# Patient Record
Sex: Female | Born: 1950 | Race: White | Hispanic: No | Smoking: Current every day smoker
Health system: Southern US, Community
[De-identification: ages and names within clinical notes are randomized; demographics above are authoritative.]

## PROBLEM LIST (undated history)

## (undated) DIAGNOSIS — M533 Sacrococcygeal disorders, not elsewhere classified: Secondary | ICD-10-CM

## (undated) DIAGNOSIS — R222 Localized swelling, mass and lump, trunk: Secondary | ICD-10-CM

## (undated) DIAGNOSIS — R0602 Shortness of breath: Secondary | ICD-10-CM

## (undated) DIAGNOSIS — M069 Rheumatoid arthritis, unspecified: Secondary | ICD-10-CM

## (undated) DIAGNOSIS — M5430 Sciatica, unspecified side: Secondary | ICD-10-CM

## (undated) DIAGNOSIS — F419 Anxiety disorder, unspecified: Secondary | ICD-10-CM

## (undated) DIAGNOSIS — R1310 Dysphagia, unspecified: Secondary | ICD-10-CM

## (undated) DIAGNOSIS — M1990 Unspecified osteoarthritis, unspecified site: Secondary | ICD-10-CM

## (undated) DIAGNOSIS — J449 Chronic obstructive pulmonary disease, unspecified: Secondary | ICD-10-CM

## (undated) DIAGNOSIS — C801 Malignant (primary) neoplasm, unspecified: Secondary | ICD-10-CM

## (undated) DIAGNOSIS — Z923 Personal history of irradiation: Secondary | ICD-10-CM

## (undated) DIAGNOSIS — C3490 Malignant neoplasm of unspecified part of unspecified bronchus or lung: Secondary | ICD-10-CM

## (undated) HISTORY — PX: ABDOMINAL HYSTERECTOMY: SHX81

## (undated) HISTORY — PX: RHINOPLASTY: SUR1284

---

## 2002-05-22 ENCOUNTER — Encounter: Payer: Self-pay | Admitting: Family Medicine

## 2002-05-22 ENCOUNTER — Ambulatory Visit (HOSPITAL_COMMUNITY): Admission: RE | Admit: 2002-05-22 | Discharge: 2002-05-22 | Payer: Self-pay | Admitting: Internal Medicine

## 2003-06-04 ENCOUNTER — Encounter: Payer: Self-pay | Admitting: Family Medicine

## 2003-06-04 ENCOUNTER — Ambulatory Visit (HOSPITAL_COMMUNITY): Admission: RE | Admit: 2003-06-04 | Discharge: 2003-06-04 | Payer: Self-pay | Admitting: Family Medicine

## 2004-05-04 ENCOUNTER — Ambulatory Visit (HOSPITAL_COMMUNITY): Admission: RE | Admit: 2004-05-04 | Discharge: 2004-05-04 | Payer: Self-pay | Admitting: Family Medicine

## 2005-03-22 ENCOUNTER — Ambulatory Visit (HOSPITAL_COMMUNITY): Admission: RE | Admit: 2005-03-22 | Discharge: 2005-03-22 | Payer: Self-pay | Admitting: Family Medicine

## 2005-03-31 ENCOUNTER — Ambulatory Visit (HOSPITAL_COMMUNITY): Admission: RE | Admit: 2005-03-31 | Discharge: 2005-03-31 | Payer: Self-pay | Admitting: Family Medicine

## 2005-08-17 ENCOUNTER — Ambulatory Visit (HOSPITAL_COMMUNITY): Admission: RE | Admit: 2005-08-17 | Discharge: 2005-08-17 | Payer: Self-pay | Admitting: Rheumatology

## 2013-01-23 ENCOUNTER — Encounter (HOSPITAL_COMMUNITY): Payer: Self-pay | Admitting: Emergency Medicine

## 2013-01-23 ENCOUNTER — Emergency Department (HOSPITAL_COMMUNITY)
Admission: EM | Admit: 2013-01-23 | Discharge: 2013-01-23 | Disposition: A | Discharge status: Home / Self Care | Payer: BC Managed Care – PPO | Attending: Emergency Medicine | Admitting: Emergency Medicine

## 2013-01-23 ENCOUNTER — Emergency Department (HOSPITAL_COMMUNITY): Payer: BC Managed Care – PPO

## 2013-01-23 DIAGNOSIS — M5431 Sciatica, right side: Secondary | ICD-10-CM

## 2013-01-23 DIAGNOSIS — M5430 Sciatica, unspecified side: Secondary | ICD-10-CM | POA: Insufficient documentation

## 2013-01-23 DIAGNOSIS — J449 Chronic obstructive pulmonary disease, unspecified: Secondary | ICD-10-CM

## 2013-01-23 DIAGNOSIS — J441 Chronic obstructive pulmonary disease with (acute) exacerbation: Secondary | ICD-10-CM | POA: Insufficient documentation

## 2013-01-23 DIAGNOSIS — F17200 Nicotine dependence, unspecified, uncomplicated: Secondary | ICD-10-CM | POA: Insufficient documentation

## 2013-01-23 DIAGNOSIS — M129 Arthropathy, unspecified: Secondary | ICD-10-CM | POA: Insufficient documentation

## 2013-01-23 HISTORY — DX: Unspecified osteoarthritis, unspecified site: M19.90

## 2013-01-23 MED ORDER — ALBUTEROL SULFATE (5 MG/ML) 0.5% IN NEBU
5.0000 mg | INHALATION_SOLUTION | Freq: Once | RESPIRATORY_TRACT | Status: AC
  Administered 2013-01-23: 5 mg via RESPIRATORY_TRACT
  Filled 2013-01-23: qty 1

## 2013-01-23 MED ORDER — IPRATROPIUM BROMIDE 0.02 % IN SOLN
0.5000 mg | Freq: Once | RESPIRATORY_TRACT | Status: AC
  Administered 2013-01-23: 0.5 mg via RESPIRATORY_TRACT
  Filled 2013-01-23: qty 2.5

## 2013-01-23 MED ORDER — IBUPROFEN 800 MG PO TABS: 800.0000 mg | ORAL_TABLET | Freq: Three times a day (TID) | ORAL | Status: DC

## 2013-01-23 MED ORDER — TRAMADOL HCL 50 MG PO TABS: 50.0000 mg | ORAL_TABLET | Freq: Four times a day (QID) | ORAL | Status: DC | PRN

## 2013-01-23 MED ORDER — ALBUTEROL SULFATE HFA 108 (90 BASE) MCG/ACT IN AERS: 1.0000 | INHALATION_SPRAY | Freq: Four times a day (QID) | RESPIRATORY_TRACT | Status: DC | PRN

## 2013-01-23 NOTE — ED Provider Notes (Signed)
History  This chart was scribed for Kristina Lennert, MD by Shari Heritage, ED Scribe. The patient was seen in room APA03/APA03. Patient's care was started at [phone].   CSN: [id number]  Arrival date & time 01/23/13  1191   First MD Initiated Contact with Patient 01/23/13 [phone]      Chief Complaint  Patient presents with  . Hip Pain  . Wheezing    Patient is a 62 y.o. female presenting with hip pain. The history is provided by the patient. No language interpreter was used.  Hip Pain This is a new problem. Episode onset: 1 month ago. The problem has been gradually worsening. Pertinent negatives include no chest pain, no abdominal pain, no headaches and no shortness of breath. Exacerbated by: sitting down. The symptoms are relieved by walking. She has tried nothing for the symptoms.     HPI Comments: Kristina Fischer is a 62 y.o. female who presents to the Emergency Department complaining of moderate to severe right hip pain that radiates down her right leg for the past month. She states that about 1 month ago, she ran into a large metal object at work. She says that she did not have pain right away. Patient states that she later developed some numbness then pain in the right hip and leg. Patient reports that pain is worse while she is sitting down, but improved with ambulation.She states that she has been taking Aleve once a day. Patient is also complaining of persistent wheezing for the past several months. Patient denies any other symptoms at this time. She has a medical history of arthritis, but reports no other chronic medical conditions. She has no PCP. Patient is a current every day smoker and does not use alcohol.   Past Medical History  Diagnosis Date  . Arthritis     Past Surgical History  Procedure Laterality Date  . Abdominal hysterectomy      History reviewed. No pertinent family history.  History  Substance Use Topics  . Smoking status: Current Every Day Smoker -- 1.00  packs/day    Types: Cigarettes  . Smokeless tobacco: Not on file  . Alcohol Use: No    OB History   Grav Para Term Preterm Abortions TAB SAB Ect Mult Living                  Review of Systems  Constitutional: Negative for appetite change and fatigue.  HENT: Negative for congestion, sinus pressure and ear discharge.   Eyes: Negative for discharge.  Respiratory: Positive for wheezing. Negative for cough and shortness of breath.   Cardiovascular: Negative for chest pain.  Gastrointestinal: Negative for abdominal pain and diarrhea.  Genitourinary: Negative for frequency and hematuria.  Musculoskeletal: Negative for back pain.  Skin: Negative for rash.  Neurological: Negative for seizures and headaches.  Psychiatric/Behavioral: Negative for hallucinations.    Allergies  Review of patient's allergies indicates no known allergies.  Home Medications   Current Outpatient Rx  Name  Route  Sig  Dispense  Refill  . naproxen sodium (ALEVE) 220 MG tablet   Oral   Take 220-440 mg by mouth 2 (two) times daily with a meal.         . OVER THE COUNTER MEDICATION   Oral   Take 2 capsules by mouth daily. Natural Supplement from health food store for arthritis.           Triage Vitals: BP 151/94  Pulse 100  Temp(Src) 97.8 F (  36.6 C) (Oral)  Resp 22  Ht 5\' 3"  (1.6 m)  Wt 112 lb (50.803 kg)  BMI 19.84 kg/m2  SpO2 99%  Physical Exam  Constitutional: She is oriented to person, place, and time. She appears well-developed.  HENT:  Head: Normocephalic.  Eyes: Conjunctivae and EOM are normal. No scleral icterus.  Neck: Neck supple. No thyromegaly present.  Cardiovascular: Normal rate and regular rhythm.  Exam reveals no gallop and no friction rub.   No murmur heard. Pulmonary/Chest: No stridor. She has wheezes (mild, bilaterally). She has no rales. She exhibits no tenderness.  Abdominal: She exhibits no distension. There is no tenderness. There is no rebound.  Musculoskeletal:  Normal range of motion. She exhibits no edema.  Negative straight leg raise bilaterally.  Lymphadenopathy:    She has no cervical adenopathy.  Neurological: She is oriented to person, place, and time. Coordination normal.  Skin: No rash noted. No erythema.  Psychiatric: She has a normal mood and affect. Her behavior is normal.    ED Course  Procedures (including critical care time) DIAGNOSTIC STUDIES: Oxygen Saturation is 99% on room air, normal by my interpretation.    COORDINATION OF CARE: 8:50 AM- Patient informed of current plan for treatment and evaluation and agrees with plan at this time.   9:42 AM- Discussed importance of quitting smoking and that persistent wheezing is likely result of this. X-ray is negative for fractures. Suspect muscle strain. Will prescribe NSAIDs and pain medicines to reduce inflammation and for pain management as needed.     Dg Hip Complete Right  01/23/2013  *RADIOLOGY REPORT*  Clinical Data: Right hip pain, wheezing, no known injury  RIGHT HIP - COMPLETE 2+ VIEW  Comparison: None.  Findings: Three views of the right hip submitted.  No acute fracture or subluxation.  Hip joint space is preserved.  IMPRESSION: No acute fractures or subluxation.   Original Report Authenticated By: Natasha Mead, M.D.      No diagnosis found.    MDM        The chart was scribed for me under my direct supervision.  I personally performed the history, physical, and medical decision making and all procedures in the evaluation of this patient.Kristina Lennert, MD 01/23/13 [phone]

## 2013-01-23 NOTE — ED Notes (Signed)
Pt states right leg/hip pain for one month. Pt states she is also having wheezing that started during the winter.

## 2013-02-26 ENCOUNTER — Encounter (HOSPITAL_COMMUNITY): Payer: Self-pay

## 2013-02-26 ENCOUNTER — Encounter (HOSPITAL_COMMUNITY): Payer: Self-pay | Admitting: *Deleted

## 2013-02-26 ENCOUNTER — Emergency Department (HOSPITAL_COMMUNITY): Payer: BC Managed Care – PPO

## 2013-02-26 ENCOUNTER — Telehealth: Payer: Self-pay | Admitting: Internal Medicine

## 2013-02-26 ENCOUNTER — Emergency Department (HOSPITAL_COMMUNITY)
Admission: EM | Admit: 2013-02-26 | Discharge: 2013-02-26 | Discharge status: Against Medical Advice | Payer: BC Managed Care – PPO | Attending: Emergency Medicine | Admitting: Emergency Medicine

## 2013-02-26 ENCOUNTER — Emergency Department (HOSPITAL_COMMUNITY)
Admission: EM | Admit: 2013-02-26 | Discharge: 2013-02-26 | Disposition: A | Discharge status: Home / Self Care | Payer: BC Managed Care – PPO | Attending: Emergency Medicine | Admitting: Emergency Medicine

## 2013-02-26 DIAGNOSIS — R222 Localized swelling, mass and lump, trunk: Secondary | ICD-10-CM | POA: Insufficient documentation

## 2013-02-26 DIAGNOSIS — R059 Cough, unspecified: Secondary | ICD-10-CM | POA: Insufficient documentation

## 2013-02-26 DIAGNOSIS — Z90710 Acquired absence of both cervix and uterus: Secondary | ICD-10-CM | POA: Insufficient documentation

## 2013-02-26 DIAGNOSIS — M79609 Pain in unspecified limb: Secondary | ICD-10-CM | POA: Insufficient documentation

## 2013-02-26 DIAGNOSIS — R109 Unspecified abdominal pain: Secondary | ICD-10-CM | POA: Insufficient documentation

## 2013-02-26 DIAGNOSIS — R918 Other nonspecific abnormal finding of lung field: Secondary | ICD-10-CM

## 2013-02-26 DIAGNOSIS — Z79899 Other long term (current) drug therapy: Secondary | ICD-10-CM | POA: Insufficient documentation

## 2013-02-26 DIAGNOSIS — J4489 Other specified chronic obstructive pulmonary disease: Secondary | ICD-10-CM | POA: Insufficient documentation

## 2013-02-26 DIAGNOSIS — R0602 Shortness of breath: Secondary | ICD-10-CM | POA: Insufficient documentation

## 2013-02-26 DIAGNOSIS — M129 Arthropathy, unspecified: Secondary | ICD-10-CM | POA: Insufficient documentation

## 2013-02-26 DIAGNOSIS — R05 Cough: Secondary | ICD-10-CM | POA: Insufficient documentation

## 2013-02-26 DIAGNOSIS — R209 Unspecified disturbances of skin sensation: Secondary | ICD-10-CM | POA: Insufficient documentation

## 2013-02-26 DIAGNOSIS — F17200 Nicotine dependence, unspecified, uncomplicated: Secondary | ICD-10-CM | POA: Insufficient documentation

## 2013-02-26 DIAGNOSIS — M25559 Pain in unspecified hip: Secondary | ICD-10-CM | POA: Insufficient documentation

## 2013-02-26 DIAGNOSIS — Z8739 Personal history of other diseases of the musculoskeletal system and connective tissue: Secondary | ICD-10-CM | POA: Insufficient documentation

## 2013-02-26 DIAGNOSIS — M545 Low back pain, unspecified: Secondary | ICD-10-CM | POA: Insufficient documentation

## 2013-02-26 DIAGNOSIS — M5430 Sciatica, unspecified side: Secondary | ICD-10-CM | POA: Insufficient documentation

## 2013-02-26 DIAGNOSIS — J449 Chronic obstructive pulmonary disease, unspecified: Secondary | ICD-10-CM | POA: Insufficient documentation

## 2013-02-26 HISTORY — DX: Sciatica, unspecified side: M54.30

## 2013-02-26 HISTORY — DX: Chronic obstructive pulmonary disease, unspecified: J44.9

## 2013-02-26 LAB — URINALYSIS, ROUTINE W REFLEX MICROSCOPIC
Bilirubin Urine: NEGATIVE
Hgb urine dipstick: NEGATIVE
Ketones, ur: NEGATIVE mg/dL
Protein, ur: NEGATIVE mg/dL
Urobilinogen, UA: 0.2 mg/dL (ref 0.0–1.0)

## 2013-02-26 LAB — COMPREHENSIVE METABOLIC PANEL
ALT: 18 U/L (ref 0–35)
Alkaline Phosphatase: 105 U/L (ref 39–117)
Chloride: 98 mEq/L (ref 96–112)
GFR calc Af Amer: >90 mL/min (ref >90)
Glucose, Bld: 110 mg/dL — ABNORMAL HIGH (ref 70–99)
Potassium: 3.9 mEq/L (ref 3.5–5.1)
Sodium: 139 mEq/L (ref 135–145)
Total Bilirubin: 0.2 mg/dL — ABNORMAL LOW (ref 0.3–1.2)
Total Protein: 6.9 g/dL (ref 6.0–8.3)

## 2013-02-26 LAB — CBC WITH DIFFERENTIAL/PLATELET
Basophils Relative: 1 % (ref 0–1)
Eosinophils Absolute: 0.1 10*3/uL (ref 0.0–0.7)
HCT: 39.9 % (ref 36.0–46.0)
Hemoglobin: 13.2 g/dL (ref 12.0–15.0)
MCH: 30.4 pg (ref 26.0–34.0)
MCHC: 33.1 g/dL (ref 30.0–36.0)
Monocytes Absolute: 0.6 10*3/uL (ref 0.1–1.0)
Monocytes Relative: 6 % (ref 3–12)
Neutrophils Relative %: 81 % — ABNORMAL HIGH (ref 43–77)

## 2013-02-26 MED ORDER — OXYCODONE-ACETAMINOPHEN 5-325 MG PO TABS: 1.0000 | ORAL_TABLET | Freq: Four times a day (QID) | ORAL | Status: DC | PRN

## 2013-02-26 MED ORDER — ONDANSETRON HCL 4 MG/2ML IJ SOLN
4.0000 mg | Freq: Once | INTRAMUSCULAR | Status: AC
  Administered 2013-02-26: 4 mg via INTRAVENOUS
  Filled 2013-02-26: qty 2

## 2013-02-26 MED ORDER — IOHEXOL 300 MG/ML  SOLN
100.0000 mL | Freq: Once | INTRAMUSCULAR | Status: AC | PRN
  Administered 2013-02-26: 100 mL via INTRAVENOUS

## 2013-02-26 MED ORDER — IOHEXOL 300 MG/ML  SOLN
50.0000 mL | INTRAMUSCULAR | Status: AC
  Administered 2013-02-26: 50 mL via ORAL

## 2013-02-26 MED ORDER — SODIUM CHLORIDE 0.9 % IV BOLUS (SEPSIS): 500.0000 mL | Freq: Once | INTRAVENOUS | Status: DC

## 2013-02-26 MED ORDER — SODIUM CHLORIDE 0.9 % IV SOLN: Freq: Once | INTRAVENOUS | Status: DC

## 2013-02-26 MED ORDER — HYDROMORPHONE HCL PF 1 MG/ML IJ SOLN
1.0000 mg | Freq: Once | INTRAMUSCULAR | Status: AC
  Administered 2013-02-26: 1 mg via INTRAVENOUS
  Filled 2013-02-26: qty 1

## 2013-02-26 MED ORDER — ACETAMINOPHEN 325 MG PO TABS
650.0000 mg | ORAL_TABLET | Freq: Once | ORAL | Status: AC
  Administered 2013-02-26: 650 mg via ORAL
  Filled 2013-02-26: qty 2

## 2013-02-26 NOTE — ED Provider Notes (Signed)
History     This chart was scribed for Kristina Quarry, MD, MD by Smitty Pluck, ED Scribe. The patient was seen in room APA15/APA15 and the patient's care was started at 8:52 AM.   CSN: [id number]  Arrival date & time 02/26/13  0836      No chief complaint on file.    Patient is a 62 y.o. female presenting with leg pain. The history is provided by medical records and the patient. No language interpreter was used.  Leg Pain Location:  Leg and hip Time since incident:  1 month Hip location:  R hip Leg location:  R leg Pain details:    Quality:  Unable to specify   Severity:  Moderate   Onset quality:  Sudden   Duration:  1 month   Timing:  Intermittent   Progression:  Unchanged Chronicity:  Recurrent Dislocation: no   Foreign body present:  No foreign bodies Prior injury to area:  Yes Relieved by:  Movement Worsened by:  Nothing tried Associated symptoms: no fever    HPI Comments: Kristina Fischer is a 62 y.o. female who presents to the Emergency Department complaining of intermittent, moderate lower back back pain radiating to right leg that has been ongoing for past month. Pt reports that she was seen in ED 1 month ago and diagnosed with sciatica. She states that she hit her right leg on metal object prior to being see in ED last month and had right leg pain 2-3 days after the incident. Pt mentions having numbness in right leg. She states walking helps relieve the pain. Pt states she feels she has a mass in her throat and she had trouble swallowing. She reports that she is not eating due to mass and she has lost 12 lbs within past month. She mentions when she eats she has abdominal pain. She mentions her last meal was 2 crackers, cookies and chips. She denies decrease in appetite but states she does not eat due to discomfort when swallowing. Pt smokes cigarettes and drinks alcohol. She states she had shot of vodka yesterday and had resulting abdominal pain. He states she has  nonproductive cough and SOB. Pt denies fever, chills, nausea, vomiting, diarrhea, weakness and any other pain.   PCP Dr. Margo Aye and her next visit is March 13, 2013.    Past Medical History  Diagnosis Date  . Arthritis     Past Surgical History  Procedure Laterality Date  . Abdominal hysterectomy      No family history on file.  History  Substance Use Topics  . Smoking status: Current Every Day Smoker -- 1.00 packs/day    Types: Cigarettes  . Smokeless tobacco: Not on file  . Alcohol Use: No    OB History   Grav Para Term Preterm Abortions TAB SAB Ect Mult Living                  Review of Systems  Constitutional: Negative for fever and chills.  HENT: Positive for trouble swallowing.   Respiratory: Positive for cough and shortness of breath.   Gastrointestinal: Positive for abdominal pain.  Neurological: Positive for numbness. Negative for weakness.  All other systems reviewed and are negative.    Allergies  Review of patient's allergies indicates no known allergies.  Home Medications   Current Outpatient Rx  Name  Route  Sig  Dispense  Refill  . albuterol (PROVENTIL HFA;VENTOLIN HFA) 108 (90 BASE) MCG/ACT inhaler   Inhalation  Inhale 1-2 puffs into the lungs every 6 (six) hours as needed for wheezing.   1 Inhaler   0   . naproxen sodium (ALEVE) 220 MG tablet   Oral   Take 220-440 mg by mouth 2 (two) times daily with a meal.         . OVER THE COUNTER MEDICATION   Oral   Take 2 capsules by mouth daily. Natural Supplement from health food store for arthritis.         Marland Kitchen traMADol (ULTRAM) 50 MG tablet   Oral   Take 1 tablet (50 mg total) by mouth every 6 (six) hours as needed for pain.   30 tablet   0     There were no vitals taken for this visit.  Physical Exam  Nursing note and vitals reviewed. Constitutional: She is oriented to person, place, and time. She appears well-developed and well-nourished. No distress.  HENT:  Head: Normocephalic  and atraumatic.  Neck: Normal range of motion. Neck supple. No JVD present. No tracheal deviation present.  Cardiovascular: Normal rate, regular rhythm and normal heart sounds.   Pulmonary/Chest: Effort normal and breath sounds normal. No respiratory distress. She has no wheezes. She has no rales.  Abdominal: Soft. She exhibits no distension. There is no tenderness. There is no rebound.  Musculoskeletal: Normal range of motion.  Good pulses in bilateral feet  Lymphadenopathy:    She has no cervical adenopathy.  Neurological: She is alert and oriented to person, place, and time. No cranial nerve deficit. Coordination normal.  Skin: Skin is warm and dry.  Psychiatric: She has a normal mood and affect. Her behavior is normal.    ED Course  Procedures (including critical care time) DIAGNOSTIC STUDIES: Oxygen Saturation is 94% on room air, low by my interpretation.    COORDINATION OF CARE: 9:01 AM Discussed ED treatment with pt and pt agrees.   Results for orders placed during the hospital encounter of 02/26/13  COMPREHENSIVE METABOLIC PANEL      Result Value Range   Sodium 139  135 - 145 mEq/L   Potassium 3.9  3.5 - 5.1 mEq/L   Chloride 98  96 - 112 mEq/L   CO2 30  19 - 32 mEq/L   Glucose, Bld 110 (*) 70 - 99 mg/dL   BUN 9  6 - 23 mg/dL   Creatinine, Ser 0.98  0.50 - 1.10 mg/dL   Calcium 9.2  8.4 - 11.9 mg/dL   Total Protein 6.9  6.0 - 8.3 g/dL   Albumin 2.8 (*) 3.5 - 5.2 g/dL   AST 27  0 - 37 U/L   ALT 18  0 - 35 U/L   Alkaline Phosphatase 105  39 - 117 U/L   Total Bilirubin 0.2 (*) 0.3 - 1.2 mg/dL   GFR calc non Af Amer >90  >90 mL/min   GFR calc Af Amer >90  >90 mL/min  LIPASE, BLOOD      Result Value Range   Lipase 15  11 - 59 U/L  CBC WITH DIFFERENTIAL      Result Value Range   WBC 10.1  4.0 - 10.5 K/uL   RBC 4.34  3.87 - 5.11 MIL/uL   Hemoglobin 13.2  12.0 - 15.0 g/dL   HCT 14.7  82.9 - 56.2 %   MCV 91.9  78.0 - 100.0 fL   MCH 30.4  26.0 - 34.0 pg   MCHC 33.1   30.0 - 36.0 g/dL  RDW 13.3  11.5 - 15.5 %   Platelets 463 (*) 150 - 400 K/uL   Neutrophils Relative % 81 (*) 43 - 77 %   Neutro Abs 8.1 (*) 1.7 - 7.7 K/uL   Lymphocytes Relative 12  12 - 46 %   Lymphs Abs 1.2  0.7 - 4.0 K/uL   Monocytes Relative 6  3 - 12 %   Monocytes Absolute 0.6  0.1 - 1.0 K/uL   Eosinophils Relative 1  0 - 5 %   Eosinophils Absolute 0.1  0.0 - 0.7 K/uL   Basophils Relative 1  0 - 1 %   Basophils Absolute 0.1  0.0 - 0.1 K/uL  URINALYSIS, ROUTINE W REFLEX MICROSCOPIC      Result Value Range   Color, Urine YELLOW  YELLOW   APPearance CLEAR  CLEAR   Specific Gravity, Urine 1.010  1.005 - 1.030   pH 6.5  5.0 - 8.0   Glucose, UA NEGATIVE  NEGATIVE mg/dL   Hgb urine dipstick NEGATIVE  NEGATIVE   Bilirubin Urine NEGATIVE  NEGATIVE   Ketones, ur NEGATIVE  NEGATIVE mg/dL   Protein, ur NEGATIVE  NEGATIVE mg/dL   Urobilinogen, UA 0.2  0.0 - 1.0 mg/dL   Nitrite NEGATIVE  NEGATIVE   Leukocytes, UA NEGATIVE  NEGATIVE     Dg Chest 2 View  02/26/2013   *RADIOLOGY REPORT*  Clinical Data: Dysphagia  CHEST - 2 VIEW  Comparison: August 17, 2005.  Findings: Hyperexpansion of the lungs is again noted consistent with chronic obstructive pulmonary disease.  Linear density is noted in left mid lung most consistent with scarring or subsegmental atelectasis.  There appears to be atelectasis of the right middle lobe with associated pleural effusion which is new since prior exam.  IMPRESSION: Atelectasis or pneumonia of right middle lobe is noted with associated pleural effusion; clinical correlation is recommended to rule out central endobronchial obstructive lesion or neoplasm.   Original Report Authenticated By: Lupita Raider.,  M.D.   Ct Chest W Contrast  02/26/2013   *RADIOLOGY REPORT*  Clinical Data:  Abnormal chest radiograph epigastric pain and weight loss.  CT CHEST, ABDOMEN AND PELVIS WITH CONTRAST  Technique:  Multidetector CT imaging of the chest, abdomen and pelvis was  performed following the standard protocol during bolus administration of intravenous contrast.  Contrast: OMNIPAQUE IOHEXOL 300 MG/ML  SOLN  Comparison:  Chest radiograph 02/26/2013 cough.  CT CHEST  Findings:  Low right paratracheal lymph node measures 2.1 cm and appears necrotic.  A large nodal mass occupies the subcarinal and right hilar regions, measuring approximately 5.8 x 7.5 cm. Narrowing of the right upper and right middle lobe pulmonary arteries in association.  Mass effect on the left atrium. A necrotic appearing lymph node intimately associated with the pericardial border measures 1.5 x 1.6 cm (image 39). Heart size normal.  No pericardial effusion.  Marked narrowing of the anterior segmental bronchus of the right upper lobe with complete obstruction of the right middle and right lower lobe bronchi.  Mass-like consolidation in the right middle lobe.  Peribronchovascular nodularity and airspace consolidation in the right lower lobe.  Small right pleural effusion.  Possible pleural thickening in the upper medial right hemithorax and is image 9). Calcified pleural plaques in the left hemithorax.  IMPRESSION:  1.  Bulky mediastinal/right hilar nodal mass with mass-like consolidation in the right middle lobe and postobstructive pneumonitis/consolidation in the right lower lobe. Please see additional findings discussion below. 2.  Small right pleural effusion, possibly malignant. 3.  Necrotic appearing lymph node intimately associated with the pericardium.  CT ABDOMEN AND PELVIS  Findings:  Heterogeneous low attenuation lesions in the right hepatic lobe measure up to 1.7 x 1.8 cm (image 60).  A necrotic appearing nodal mass in the porta hepatis measures 3.0 x 3.8 cm. Narrowing and probable invasion of the main portal vein in association (example image 62).  Gallbladder is unremarkable.  A lesion in the right adrenal gland measures 1.2 x 1.8 cm.  Left adrenal lesion measures 0.9 x 1.5 cm.  Kidneys,  spleen, pancreas, stomach and bowel are unremarkable.  A 2 mm nodule is seen in the posterior left lower quadrant, adjacent to the descending colon (image 79).  Similarly, a subcutaneous nodule just superior to the left iliac crest measures 1.0 x 1.3 cm (image 84).  Atherosclerotic calcification of the arterial vasculature without abdominal aortic aneurysm.  No pathologically enlarged retroperitoneal or mesenteric lymph nodes.  No free fluid.  Lytic expansion is seen in the right sacrum with associated fracture (coronal image 51).  Sclerotic lesions are seen in the right iliac wing and T3 vertebral body and are not necessarily metastatic as bone islands would appear similar.  IMPRESSION:  1.  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  Additional soft tissue nodules in the left lower quadrant and in the subcutaneous fat overlying the left iliac crest.  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 2.  Necrotic appearing porta hepatis adenopathy with probable invasion of the adjacent portal vein. 3.  Pathologic right sacral fracture.   Original Report Authenticated By: Leanna Battles, M.D.   Ct Abdomen Pelvis W Contrast  02/26/2013   *RADIOLOGY REPORT*  Clinical Data:  Abnormal chest radiograph epigastric pain and weight loss.  CT CHEST, ABDOMEN AND PELVIS WITH CONTRAST  Technique:  Multidetector CT imaging of the chest, abdomen and pelvis was performed following the standard protocol during bolus administration of intravenous contrast.  Contrast: OMNIPAQUE IOHEXOL 300 MG/ML  SOLN  Comparison:  Chest radiograph 02/26/2013 cough.  CT CHEST  Findings:  Low right paratracheal lymph node measures 2.1 cm and appears necrotic.  A large nodal mass occupies the subcarinal and right hilar regions, measuring approximately 5.8 x 7.5 cm. Narrowing of the right upper and right middle  lobe pulmonary arteries in association.  Mass effect on the left atrium. A necrotic appearing lymph node intimately associated with the pericardial border measures 1.5 x 1.6 cm (image 39). Heart size normal.  No pericardial effusion.  Marked narrowing of the anterior segmental bronchus of the right upper lobe with complete obstruction of the right middle and right lower lobe bronchi.  Mass-like consolidation in the right middle lobe.  Peribronchovascular nodularity and airspace consolidation in the right lower lobe.  Small right pleural effusion.  Possible pleural thickening in the upper medial right hemithorax and is image 9). Calcified pleural plaques in the left hemithorax.  IMPRESSION:  1.  Bulky mediastinal/right hilar nodal mass with mass-like consolidation in the right middle lobe and postobstructive pneumonitis/consolidation in the right lower lobe. Please see additional findings discussion below. 2.  Small right pleural effusion, possibly malignant. 3.  Necrotic appearing lymph node intimately associated with the pericardium.  CT ABDOMEN AND PELVIS  Findings:  Heterogeneous low attenuation lesions in the right hepatic lobe measure up to 1.7 x 1.8 cm (image 60).  A necrotic appearing nodal mass in the porta hepatis measures 3.0 x 3.8 cm. Narrowing and probable invasion of the main portal vein in association (example image 62).  Gallbladder is unremarkable.  A lesion in the right adrenal gland measures 1.2 x 1.8 cm.  Left adrenal lesion measures 0.9 x 1.5 cm.  Kidneys, spleen, pancreas, stomach and bowel are unremarkable.  A 2 mm nodule is seen in the posterior left lower quadrant, adjacent to the descending colon (image 79).  Similarly, a subcutaneous nodule just superior to the left iliac crest measures 1.0 x 1.3 cm (image 84).  Atherosclerotic calcification of the arterial vasculature without abdominal aortic aneurysm.  No pathologically enlarged retroperitoneal or mesenteric lymph nodes.  No free fluid.   Lytic expansion is seen in the right sacrum with associated fracture (coronal image 51).  Sclerotic lesions are seen in the right iliac wing and T3 vertebral body and are not necessarily metastatic as bone islands would appear similar.  IMPRESSION:  1.  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  Additional soft tissue nodules in the left lower quadrant and in the subcutaneous fat overlying the left iliac crest.  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 2.  Necrotic appearing porta hepatis adenopathy with probable invasion of the adjacent portal vein. 3.  Pathologic right sacral fracture.   Original Report Authenticated By: Leanna Battles, M.D.    I have reviewed the report and personally reviewed the above radiology studies.   No diagnosis found.    MDM  62 y.o. Smoker presents today complaining of difficulty swallowing, dyspnea, and weight loss.  Work up shows a lung mass with metastatic disease and pathologic sacral fracture.    I discussed the above findings with the patient and need for further evaluation inpatient.  Patient left ama although advised that she needs to stay.  Patient left after I discussed the above with her but was, initially, staying so I could discuss further.  However, patient left before I was able to return and make plans for op f/u or treatment plan if she left.   I had advised her to return if she changes her mind.    I personally performed the services described in this documentation, which was scribed in my presence. The recorded information has been reviewed and considered.   Kristina Quarry, MD 02/28/13 1048

## 2013-02-26 NOTE — ED Provider Notes (Signed)
History     CSN: [id number]  Arrival date & time 02/26/13  1629   First MD Initiated Contact with Patient 02/26/13 1708      No chief complaint on file.   (Consider location/radiation/quality/duration/timing/severity/associated sxs/prior treatment) Patient is a 62 y.o. female presenting with hip pain. The history is provided by the patient (pt was seen earlier in ;the er for lung mass.  she left [facility] but is back now).  Hip Pain This is a new problem. The current episode started more than 2 days ago. The problem occurs constantly. The problem has not changed since onset.Pertinent negatives include no chest pain, no abdominal pain and no headaches. Nothing aggravates the symptoms. Nothing relieves the symptoms.    Past Medical History  Diagnosis Date  . Arthritis   . Sciatica   . COPD (chronic obstructive pulmonary disease)     Past Surgical History  Procedure Laterality Date  . Abdominal hysterectomy      History reviewed. No pertinent family history.  History  Substance Use Topics  . Smoking status: Current Every Day Smoker -- 1.00 packs/day    Types: Cigarettes  . Smokeless tobacco: Not on file  . Alcohol Use: Yes    OB History   Grav Para Term Preterm Abortions TAB SAB Ect Mult Living                  Review of Systems  Constitutional: Negative for appetite change and fatigue.  HENT: Negative for congestion, sinus pressure and ear discharge.   Eyes: Negative for discharge.  Respiratory: Negative for cough.   Cardiovascular: Negative for chest pain.  Gastrointestinal: Negative for abdominal pain and diarrhea.  Genitourinary: Negative for frequency and hematuria.  Musculoskeletal: Negative for back pain.       Pain right hip  Skin: Negative for rash.  Neurological: Negative for seizures and headaches.  Psychiatric/Behavioral: Negative for hallucinations.    Allergies  Review of patient's allergies indicates no known allergies.  Home Medications    Current Outpatient Rx  Name  Route  Sig  Dispense  Refill  . albuterol (PROVENTIL HFA;VENTOLIN HFA) 108 (90 BASE) MCG/ACT inhaler   Inhalation   Inhale 1-2 puffs into the lungs every 6 (six) hours as needed for wheezing.   1 Inhaler   0   . naproxen sodium (ALEVE) 220 MG tablet   Oral   Take 220-440 mg by mouth 2 (two) times daily with a meal.         . OVER THE COUNTER MEDICATION   Oral   Take 2 capsules by mouth daily. Natural Supplement from health food store for arthritis.         Marland Kitchen oxyCODONE-acetaminophen (PERCOCET/ROXICET) 5-325 MG per tablet   Oral   Take 1 tablet by mouth every 6 (six) hours as needed for pain.   20 tablet   0   . traMADol (ULTRAM) 50 MG tablet   Oral   Take 1 tablet (50 mg total) by mouth every 6 (six) hours as needed for pain.   30 tablet   0     BP 150/73  Pulse 114  Temp(Src) 96.9 F (36.1 C) (Oral)  Resp 18  Ht 5\' 3"  (1.6 m)  Wt 103 lb (46.72 kg)  BMI 18.25 kg/m2  SpO2 98%  Physical Exam  Constitutional: She is oriented to person, place, and time. She appears well-developed.  HENT:  Head: Normocephalic.  Eyes: Conjunctivae and EOM are normal. No scleral icterus.  Neck: Neck supple. No thyromegaly present.  Cardiovascular: Normal rate and regular rhythm.  Exam reveals no gallop and no friction rub.   No murmur heard. Pulmonary/Chest: No stridor. She has no wheezes. She has no rales. She exhibits no tenderness.  Abdominal: She exhibits no distension. There is no tenderness. There is no rebound.  Musculoskeletal: Normal range of motion. She exhibits no edema.  Tender right hip  Lymphadenopathy:    She has no cervical adenopathy.  Neurological: She is oriented to person, place, and time. Coordination normal.  Skin: No rash noted. No erythema.  Psychiatric: She has a normal mood and affect. Her behavior is normal.    ED Course  Procedures (including critical care time)  Labs Reviewed - No data to display Dg Chest 2  View  02/26/2013   *RADIOLOGY REPORT*  Clinical Data: Dysphagia  CHEST - 2 VIEW  Comparison: August 17, 2005.  Findings: Hyperexpansion of the lungs is again noted consistent with chronic obstructive pulmonary disease.  Linear density is noted in left mid lung most consistent with scarring or subsegmental atelectasis.  There appears to be atelectasis of the right middle lobe with associated pleural effusion which is new since prior exam.  IMPRESSION: Atelectasis or pneumonia of right middle lobe is noted with associated pleural effusion; clinical correlation is recommended to rule out central endobronchial obstructive lesion or neoplasm.   Original Report Authenticated By: Lupita Raider.,  M.D.   Ct Chest W Contrast  02/26/2013   *RADIOLOGY REPORT*  Clinical Data:  Abnormal chest radiograph epigastric pain and weight loss.  CT CHEST, ABDOMEN AND PELVIS WITH CONTRAST  Technique:  Multidetector CT imaging of the chest, abdomen and pelvis was performed following the standard protocol during bolus administration of intravenous contrast.  Contrast: OMNIPAQUE IOHEXOL 300 MG/ML  SOLN  Comparison:  Chest radiograph 02/26/2013 cough.  CT CHEST  Findings:  Low right paratracheal lymph node measures 2.1 cm and appears necrotic.  A large nodal mass occupies the subcarinal and right hilar regions, measuring approximately 5.8 x 7.5 cm. Narrowing of the right upper and right middle lobe pulmonary arteries in association.  Mass effect on the left atrium. A necrotic appearing lymph node intimately associated with the pericardial border measures 1.5 x 1.6 cm (image 39). Heart size normal.  No pericardial effusion.  Marked narrowing of the anterior segmental bronchus of the right upper lobe with complete obstruction of the right middle and right lower lobe bronchi.  Mass-like consolidation in the right middle lobe.  Peribronchovascular nodularity and airspace consolidation in the right lower lobe.  Small right pleural  effusion.  Possible pleural thickening in the upper medial right hemithorax and is image 9). Calcified pleural plaques in the left hemithorax.  IMPRESSION:  1.  Bulky mediastinal/right hilar nodal mass with mass-like consolidation in the right middle lobe and postobstructive pneumonitis/consolidation in the right lower lobe. Please see additional findings discussion below. 2.  Small right pleural effusion, possibly malignant. 3.  Necrotic appearing lymph node intimately associated with the pericardium.  CT ABDOMEN AND PELVIS  Findings:  Heterogeneous low attenuation lesions in the right hepatic lobe measure up to 1.7 x 1.8 cm (image 60).  A necrotic appearing nodal mass in the porta hepatis measures 3.0 x 3.8 cm. Narrowing and probable invasion of the main portal vein in association (example image 62).  Gallbladder is unremarkable.  A lesion in the right adrenal gland measures 1.2 x 1.8 cm.  Left adrenal lesion measures 0.9 x  1.5 cm.  Kidneys, spleen, pancreas, stomach and bowel are unremarkable.  A 2 mm nodule is seen in the posterior left lower quadrant, adjacent to the descending colon (image 79).  Similarly, a subcutaneous nodule just superior to the left iliac crest measures 1.0 x 1.3 cm (image 84).  Atherosclerotic calcification of the arterial vasculature without abdominal aortic aneurysm.  No pathologically enlarged retroperitoneal or mesenteric lymph nodes.  No free fluid.  Lytic expansion is seen in the right sacrum with associated fracture (coronal image 51).  Sclerotic lesions are seen in the right iliac wing and T3 vertebral body and are not necessarily metastatic as bone islands would appear similar.  IMPRESSION:  1.  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  Additional soft tissue nodules in the left lower quadrant and in the subcutaneous fat overlying the left iliac crest.  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 2.  Necrotic appearing porta hepatis adenopathy with probable invasion of the adjacent portal vein. 3.  Pathologic right sacral fracture.   Original Report Authenticated By: Leanna Battles, M.D.   Ct Abdomen Pelvis W Contrast  02/26/2013   *RADIOLOGY REPORT*  Clinical Data:  Abnormal chest radiograph epigastric pain and weight loss.  CT CHEST, ABDOMEN AND PELVIS WITH CONTRAST  Technique:  Multidetector CT imaging of the chest, abdomen and pelvis was performed following the standard protocol during bolus administration of intravenous contrast.  Contrast: OMNIPAQUE IOHEXOL 300 MG/ML  SOLN  Comparison:  Chest radiograph 02/26/2013 cough.  CT CHEST  Findings:  Low right paratracheal lymph node measures 2.1 cm and appears necrotic.  A large nodal mass occupies the subcarinal and right hilar regions, measuring approximately 5.8 x 7.5 cm. Narrowing of the right upper and right middle lobe pulmonary arteries in association.  Mass effect on the left atrium. A necrotic appearing lymph node intimately associated with the pericardial border measures 1.5 x 1.6 cm (image 39). Heart size normal.  No pericardial effusion.  Marked narrowing of the anterior segmental bronchus of the right upper lobe with complete obstruction of the right middle and right lower lobe bronchi.  Mass-like consolidation in the right middle lobe.  Peribronchovascular nodularity and airspace consolidation in the right lower lobe.  Small right pleural effusion.  Possible pleural thickening in the upper medial right hemithorax and is image 9). Calcified pleural plaques in the left hemithorax.  IMPRESSION:  1.  Bulky mediastinal/right hilar nodal mass with mass-like consolidation in the right middle lobe and postobstructive pneumonitis/consolidation in the right lower lobe. Please see additional findings discussion below. 2.  Small right pleural effusion, possibly malignant. 3.  Necrotic  appearing lymph node intimately associated with the pericardium.  CT ABDOMEN AND PELVIS  Findings:  Heterogeneous low attenuation lesions in the right hepatic lobe measure up to 1.7 x 1.8 cm (image 60).  A necrotic appearing nodal mass in the porta hepatis measures 3.0 x 3.8 cm. Narrowing and probable invasion of the main portal vein in association (example image 62).  Gallbladder is unremarkable.  A lesion in the right adrenal gland measures 1.2 x 1.8 cm.  Left adrenal lesion measures 0.9 x 1.5 cm.  Kidneys, spleen, pancreas, stomach and bowel are unremarkable.  A 2 mm nodule is seen in the posterior left lower quadrant, adjacent to the descending colon (image 79).  Similarly, a subcutaneous nodule just superior to the left iliac crest  measures 1.0 x 1.3 cm (image 84).  Atherosclerotic calcification of the arterial vasculature without abdominal aortic aneurysm.  No pathologically enlarged retroperitoneal or mesenteric lymph nodes.  No free fluid.  Lytic expansion is seen in the right sacrum with associated fracture (coronal image 51).  Sclerotic lesions are seen in the right iliac wing and T3 vertebral body and are not necessarily metastatic as bone islands would appear similar.  IMPRESSION:  1.  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  Additional soft tissue nodules in the left lower quadrant and in the subcutaneous fat overlying the left iliac crest.  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 2.  Necrotic appearing porta hepatis adenopathy with probable invasion of the adjacent portal vein. 3.  Pathologic right sacral fracture.   Original Report Authenticated By: Leanna Battles, M.D.     1. Lung mass       MDM  Lung mass,   I spoke with pulmonary and it was felt the pt could be worked up as an out pt.  She will be seen this week.        Benny Lennert, MD 02/26/13 [phone]

## 2013-02-26 NOTE — ED Notes (Signed)
Pt c/o right leg and foot pain for a month, also states she does not have a primary care md but has appt to see dr hall in June.  Pt reports that for at least a month she has felt a "fullness" in her throat like something is stuck.   No resp diff noted

## 2013-02-26 NOTE — ED Notes (Signed)
Pt started drinking 2nd bottle now of contrast. Nad.

## 2013-02-26 NOTE — Telephone Encounter (Signed)
I got a call from the physician at [facility] emergency department. Physician name is Dr. Estell Harpin. This patient went to ER and found to have lung mass with mets suggestive of lung cancer. Please work her into office for 02/27/13 aor 02/28/13. Patient might have seen Dr Delford Field as inpatient or opd; not sure  Dr. Kalman Shan, M.D., [facility].C.P Pulmonary and Critical Care Medicine Staff Physician [facility] [place] [facility] Pager: [phone], If no answer or between  15:00h - 7:00h: call [phone]  02/26/2013 6:27 PM

## 2013-02-26 NOTE — ED Notes (Signed)
Seen here earlier and dx with lung problem. Was to be admitted , but  Pt had to leave.

## 2013-02-27 NOTE — Telephone Encounter (Signed)
Appt set for tomorrow at 2pm with KC. Carron Curie, CMA

## 2013-02-28 ENCOUNTER — Encounter: Payer: Self-pay | Admitting: Pulmonary Disease

## 2013-02-28 ENCOUNTER — Ambulatory Visit (INDEPENDENT_AMBULATORY_CARE_PROVIDER_SITE_OTHER): Payer: BC Managed Care – PPO | Admitting: Pulmonary Disease

## 2013-02-28 ENCOUNTER — Telehealth: Payer: Self-pay | Admitting: *Deleted

## 2013-02-28 VITALS — BP 126/88 | HR 111 | Temp 98.2°F | Ht 61.5 in | Wt 100.8 lb

## 2013-02-28 DIAGNOSIS — S322XXA Fracture of coccyx, initial encounter for closed fracture: Secondary | ICD-10-CM

## 2013-02-28 DIAGNOSIS — S3210XA Unspecified fracture of sacrum, initial encounter for closed fracture: Secondary | ICD-10-CM | POA: Insufficient documentation

## 2013-02-28 DIAGNOSIS — R222 Localized swelling, mass and lump, trunk: Secondary | ICD-10-CM

## 2013-02-28 DIAGNOSIS — R918 Other nonspecific abnormal finding of lung field: Secondary | ICD-10-CM | POA: Insufficient documentation

## 2013-02-28 MED ORDER — ALPRAZOLAM 0.5 MG PO TABS: ORAL_TABLET | ORAL | Status: DC

## 2013-02-28 NOTE — Telephone Encounter (Signed)
1)  Spoke with Lyla Son at [facility]-- [phone] CT will be down for repair tomorrow at pts scheduled appt. Only open appt at 9am is booked up and [facility] is booked tomorrow. Dr Ruthe Mannan that you spoke with that "Okayed" the CT advised that she go ahead and have a U/S biopsy instead at 3pm.  Lyla Son states that patient however will NOT be done with U/S Biopsy appt in time to make it to her MRI---she is going to work on get appts moved around so that she may still have both appts on the same day tomorrow.   Pt will need to arrive at 1pm for U/S Biopsy.   Please advise if all this is okay and also if okay, order needs to be placed. Thanks.

## 2013-02-28 NOTE — Assessment & Plan Note (Signed)
The patient has bulky mediastinal and hilar lymphadenopathy with compression of the right middle and right lower lobe bronchi and evidence for distant metastases.  This is most consistent with small cell carcinoma.  She will obviously need a tissue diagnosis, followed by oncology evaluation.  I have spoken to interventional radiology, and they are able to biopsy one of the distant lesions tomorrow.  They will look at the scan and pick the best yield/safest biopsy site.

## 2013-02-28 NOTE — Telephone Encounter (Signed)
[name] from [facility] called back and stated she is off work now.  Please ask for Tiffany when we call back @ [phone] to advise about order. Kristina Fischer

## 2013-02-28 NOTE — Assessment & Plan Note (Signed)
The patient clearly has a pathologic fracture of her right sacrum.  I am concerned because she is having symptoms suggestive of nerve root involvement.  It is unclear if this is because of the sacral fracture, or if she could have spinal cord involvement with metastatic disease.  I have asked the patient to come in to the hospital today so that we could expedite the workup quickly because of her neurologic symptoms.  She would like to try and do this as an outpatient, and will be scheduled for an MRI of her lumbar spine in the morning.  She understands to go to the emergency room if she has any worsening neurologic symptoms of her lower extremities.

## 2013-02-28 NOTE — Patient Instructions (Addendum)
Will schedule for biopsy tomorrow by radiology to get a diagnosis Will try and get MRI of your spine for evaluation as well.

## 2013-02-28 NOTE — Progress Notes (Addendum)
  Subjective:    Patient ID: Kristina Fischer, female    DOB: Nov 25, 1950, 62 y.o.   MRN: [id number]  HPI The patient is a 62 year old female who been asked to see for an abnormal CT chest.  The patient has been having issues with right back and hip pain, and went to the emergency room recently with difficulty swallowing.  She felt that food was getting stuck in her upper chest when eating.  She had a CT scan of her chest done in the emergency room that showed massive nodal conglomerate that occupies the subcarinal and right hilar regions with compression of the right middle lobe and right lower lobe bronchus.  There was consolidation in the right middle lobe as well as the right lower lobe.  There was a small right pleural effusion.  In the abdomen, she was noted to have hepatic lesions, and adrenal lesion, as well as a lytic lesion in the right sacrum with associated fracture.  Attempts were made to admit the patient for further evaluation, however she wished to pursue an outpatient workup.  She has continued to have significant pain down her leg in any neuropathic distribution, as well as numbness in her right foot.  She has had some weakness in the right leg.  The patient has a long history of smoking, and continues to do so.  She has a chronic cough with very little mucus, and denies hemoptysis.  She has lost 15 pounds in the last 4 weeks.  She does not feel that her breathing is much different from her usual baseline.   Review of Systems  Constitutional: Negative for fever and unexpected weight change.  HENT: Positive for trouble swallowing. Negative for ear pain, nosebleeds, congestion, sore throat, rhinorrhea, sneezing, dental problem, postnasal drip and sinus pressure.   Eyes: Negative for redness and itching.  Respiratory: Positive for cough ( dry) and shortness of breath. Negative for chest tightness and wheezing.   Cardiovascular: Negative for palpitations and leg swelling.  Gastrointestinal:  Negative for nausea and vomiting.  Genitourinary: Negative for dysuria.  Musculoskeletal: Negative for joint swelling.  Skin: Negative for rash.  Neurological: Negative for headaches.  Hematological: Does not bruise/bleed easily.  Psychiatric/Behavioral: Negative for dysphoric mood. The patient is not nervous/anxious.        Objective:   Physical Exam Constitutional: thin female, no acute distress  HENT:  Nares patent without discharge  Oropharynx without exudate, palate and uvula are normal  Eyes:  Perrla, eomi, no scleral icterus  Neck:  No JVD, no TMG  Cardiovascular:  Normal rate, regular rhythm, no rubs or gallops.  No murmurs        Intact distal pulses  Pulmonary :  Decreased  breath sounds especially in right base, no stridor or respiratory distress   No rhonchi, or wheezing.  A few scattered crackles.    Abdominal:  Soft, nondistended, bowel sounds present.  No tenderness noted.   Musculoskeletal:  No lower extremity edema noted.  Lymph Nodes:  No cervical lymphadenopathy noted  Skin:  No cyanosis noted  Neurologic:  Alert, appropriate, moves all 4 extremities with some weakness in RLE.          Assessment & Plan:

## 2013-02-28 NOTE — Addendum Note (Signed)
Addended by: Barbaraann Share on: 02/28/2013 06:11 PM   Modules accepted: Level of Service

## 2013-02-28 NOTE — Telephone Encounter (Signed)
Kristina Fischer long calling again, wanting order placed. I explained that Dr Shelle Iron is still seeing patients and order will be placed in between or as soon as he is finished seeing patients.  Dr Shelle Iron please advise. thanks

## 2013-03-01 ENCOUNTER — Ambulatory Visit (HOSPITAL_COMMUNITY)
Admission: RE | Admit: 2013-03-01 | Discharge: 2013-03-01 | Disposition: A | Discharge status: Home / Self Care | Payer: BC Managed Care – PPO | Source: Ambulatory Visit | Attending: Pulmonary Disease | Admitting: Pulmonary Disease

## 2013-03-01 ENCOUNTER — Encounter (HOSPITAL_COMMUNITY): Payer: Self-pay

## 2013-03-01 ENCOUNTER — Other Ambulatory Visit: Payer: Self-pay | Admitting: Pulmonary Disease

## 2013-03-01 DIAGNOSIS — M5137 Other intervertebral disc degeneration, lumbosacral region: Secondary | ICD-10-CM | POA: Insufficient documentation

## 2013-03-01 DIAGNOSIS — M51379 Other intervertebral disc degeneration, lumbosacral region without mention of lumbar back pain or lower extremity pain: Secondary | ICD-10-CM | POA: Insufficient documentation

## 2013-03-01 DIAGNOSIS — J449 Chronic obstructive pulmonary disease, unspecified: Secondary | ICD-10-CM | POA: Insufficient documentation

## 2013-03-01 DIAGNOSIS — K769 Liver disease, unspecified: Secondary | ICD-10-CM

## 2013-03-01 DIAGNOSIS — C7A1 Malignant poorly differentiated neuroendocrine tumors: Secondary | ICD-10-CM | POA: Insufficient documentation

## 2013-03-01 DIAGNOSIS — C7B8 Other secondary neuroendocrine tumors: Secondary | ICD-10-CM | POA: Insufficient documentation

## 2013-03-01 DIAGNOSIS — R918 Other nonspecific abnormal finding of lung field: Secondary | ICD-10-CM

## 2013-03-01 DIAGNOSIS — S3210XA Unspecified fracture of sacrum, initial encounter for closed fracture: Secondary | ICD-10-CM

## 2013-03-01 DIAGNOSIS — J4489 Other specified chronic obstructive pulmonary disease: Secondary | ICD-10-CM | POA: Insufficient documentation

## 2013-03-01 DIAGNOSIS — K7689 Other specified diseases of liver: Secondary | ICD-10-CM | POA: Insufficient documentation

## 2013-03-01 LAB — APTT: aPTT: 37 seconds (ref 24–37)

## 2013-03-01 LAB — CBC
MCHC: 34 g/dL (ref 30.0–36.0)
Platelets: 474 10*3/uL — ABNORMAL HIGH (ref 150–400)
RDW: 13.5 % (ref 11.5–15.5)
WBC: 9.2 10*3/uL (ref 4.0–10.5)

## 2013-03-01 LAB — PROTIME-INR: Prothrombin Time: 14.5 seconds (ref 11.6–15.2)

## 2013-03-01 MED ORDER — FENTANYL CITRATE 0.05 MG/ML IJ SOLN
INTRAMUSCULAR | Status: AC
  Filled 2013-03-01: qty 4

## 2013-03-01 MED ORDER — MIDAZOLAM HCL 2 MG/2ML IJ SOLN
INTRAMUSCULAR | Status: AC
  Filled 2013-03-01: qty 4

## 2013-03-01 MED ORDER — SODIUM CHLORIDE 0.9 % IV SOLN
INTRAVENOUS | Status: DC
  Administered 2013-03-01: 13:00:00 via INTRAVENOUS

## 2013-03-01 MED ORDER — GADOBENATE DIMEGLUMINE 529 MG/ML IV SOLN
9.0000 mL | Freq: Once | INTRAVENOUS | Status: AC | PRN
  Administered 2013-03-01: 9 mL via INTRAVENOUS

## 2013-03-01 MED ORDER — MIDAZOLAM HCL 2 MG/2ML IJ SOLN
INTRAMUSCULAR | Status: AC | PRN
  Administered 2013-03-01: 2 mg via INTRAVENOUS

## 2013-03-01 MED ORDER — FENTANYL CITRATE 0.05 MG/ML IJ SOLN
INTRAMUSCULAR | Status: AC | PRN
  Administered 2013-03-01: 150 ug via INTRAVENOUS

## 2013-03-01 MED ORDER — HYDROMORPHONE HCL PF 2 MG/ML IJ SOLN: 1.0000 mg | INTRAMUSCULAR | Status: DC | PRN

## 2013-03-01 MED ORDER — HYDROMORPHONE HCL PF 1 MG/ML IJ SOLN
INTRAMUSCULAR | Status: AC
  Administered 2013-03-01: 1 mg
  Filled 2013-03-01: qty 1

## 2013-03-01 NOTE — Progress Notes (Signed)
Transported pt to [facility] via wheelchair.  Pt received 1mg  IV dilaudid at 1703, informed MRI staff of this.  Informed MRI staff of pt dressing to lower back and that pt had a bone marrow biopsy done this afternoon.  Informed MRI staff that pt's daughter would pick her up around 6pm at MRI.  Pt alert and in stable condition, VSS, afebrile upon transfer to MRI.  Pt had her belongings in a bag with her upon transfer.

## 2013-03-01 NOTE — Progress Notes (Signed)
Called pt's daughter and gave her update on pt d/c time.  Informed her to be at MRI for pt pickup around 6pm.  Daughter and grand-daughter also spoke to pt on the phone at this time.

## 2013-03-01 NOTE — H&P (Signed)
Chief Complaint: "I'm here for a biopsy" Referring Physician:Clance HPI: Kristina Fischer is an 62 y.o. female with findings of lung mass and multiple distant lesions concerning for metastases. She is referred to IR for tissue biopsy and after review of imaging, pt is scheduled for US guided liver lesion biopsy. PMHx and meds reviewed.  Past Medical History:  Past Medical History  Diagnosis Date  . Arthritis   . Sciatica   . COPD (chronic obstructive pulmonary disease)     Past Surgical History:  Past Surgical History  Procedure Laterality Date  . Abdominal hysterectomy      Family History: History reviewed. No pertinent family history.  Social History:  reports that she has been smoking Cigarettes.  She has been smoking about 1.00 pack per day. She does not have any smokeless tobacco history on file. She reports that  drinks alcohol. She reports that she does not use illicit drugs.  Allergies: No Known Allergies  Medications: inhaler (Taking) 1 Inhaler 0 01/23/2013 Sig - Route: Inhale 1-2 puffs into the lungs every 6 (six) hours as needed for wheezing. - Inhalation Class: Print Number of times this order has been changed since signing: 1 Order [address] oxyCODONE-acetaminophen (PERCOCET/ROXICET) 5-325 MG per tablet (Taking) 20 tablet 0 02/26/2013 Sig - Route: Take 1 tablet by mouth every 6 (six) hours as needed for pain. - Oral Class: Print Number of times this order has been changed since signing: 1 Order [address] OVER THE COUNTER MEDICATION Sig - Route: Take 2 capsules by mouth daily. Natural Supplement from health food store for arthritis    Please HPI for pertinent positives, otherwise complete 10 system ROS negative.  Physical Exam: Temp: 98.1, HR: 110, RR: 18, BP: 147/86, O2: 92%   General Appearance:  Alert, thin, cooperative, no distress, appears stated age  Head:  Normocephalic, without obvious abnormality, atraumatic  ENT: Unremarkable  Neck: Supple, symmetrical,  trachea midline  Lungs:   Clear to auscultation bilaterally, no w/r/r, respirations unlabored without use of accessory muscles.  Heart:  Regular rate and rhythm, S1, S2 normal, no murmur, rub or gallop.  Abdomen:   Soft, non-tender, non distended.  Neurologic: Normal affect, no gross deficits.   Results for orders placed during the hospital encounter of 03/01/13 (from the past 48 hour(s))  CBC     Status: Abnormal   Collection Time    03/01/13  1:10 PM      Result Value Range   WBC 9.2  4.0 - 10.5 K/uL   RBC 4.42  3.87 - 5.11 MIL/uL   Hemoglobin 13.4  12.0 - 15.0 g/dL   HCT 16.1  09.6 - 04.5 %   MCV 89.1  78.0 - 100.0 fL   MCH 30.3  26.0 - 34.0 pg   MCHC 34.0  30.0 - 36.0 g/dL   RDW 40.9  81.1 - 91.4 %   Platelets 474 (*) 150 - 400 K/uL  APTT     Status: None   Collection Time    03/01/13  1:10 PM      Result Value Range   aPTT 37  24 - 37 seconds   Comment:            IF BASELINE aPTT IS ELEVATED,     SUGGEST PATIENT RISK ASSESSMENT     BE USED TO DETERMINE APPROPRIATE     ANTICOAGULANT THERAPY.  PROTIME-INR     Status: None   Collection Time    03/01/13  1:10  PM      Result Value Range   Prothrombin Time 14.5  11.6 - 15.2 seconds   INR 1.15  0.00 - 1.49   No results found.  Assessment/Plan Lung mass with liver lesions For US guided liver lesion biopsy today Discussed procedure, risks, complications, use of sedation, and recovery. Labs reviewed. Consent signed in chart  Brayton El PA-C 03/01/2013, 1:42 PM

## 2013-03-01 NOTE — Procedures (Signed)
Sacral Bx 18 g core times 5 No comp

## 2013-03-02 ENCOUNTER — Encounter: Payer: Self-pay | Admitting: *Deleted

## 2013-03-02 ENCOUNTER — Other Ambulatory Visit (HOSPITAL_COMMUNITY): Payer: BC Managed Care – PPO

## 2013-03-02 ENCOUNTER — Telehealth: Payer: Self-pay | Admitting: Pulmonary Disease

## 2013-03-02 MED ORDER — HYDROCODONE-ACETAMINOPHEN 5-325 MG PO TABS: 1.0000 | ORAL_TABLET | Freq: Four times a day (QID) | ORAL | Status: DC | PRN

## 2013-03-02 NOTE — Telephone Encounter (Signed)
Please fax in script for this pt for percocet 5/325  One every 6hrs if needed for pain.  #20, no fills To [facility].

## 2013-03-02 NOTE — Telephone Encounter (Signed)
Per KC can call in vicodin 5/325 1-2 every 6 hrs prn #40 x 0 refills  Pt can either come pick up rx for the percocet or have the vicidon called in.   I called pt and she stated she will have the vicodin called in for her. I called [facility] and gave verbal order. Nothing further was needed

## 2013-03-02 NOTE — Progress Notes (Signed)
Histology and Location of Primary Cancer: Right Middle Lobe with bulky mediastinal/right hilar nodes  Sites of Visceral and Bony Metastatic Disease: Lytic Lesion in the right sacrum with associated fracture for which she was referred for Radiation Therapy.    Lesions in the right hepatic lobe, porta hepatis, right adrenal gland,left lower quadrant adjacent to the descending colon, and a "necrotic appearing lymph node intimately associated with the pericardium."  Location(s) of Symptomatic Metastases:  Presented to ED with right back and hip pain with radiation  Down her right leg and numbness in her right foot.- Lytic lesion in the right sacrum.  Weakness of right lower extremity C/o  difficulty swallowing and food getting stuck in her upper chest when eating.  Past/Anticipated chemotherapy by medical oncology, if any: Dr. Arbutus Ped on 03/08/13  Pain on a scale of 0-10 is: Pain right buttock, posterior right thigh and right ankle.  Unable to sit on the right buttock. Grades pain as a level 4 on a scale of 0-10 since she took pain medication.   If Spine Met(s), symptoms, if any, include:  Bowel/Bladder retention or incontinence (please describe): No  Numbness or weakness in extremities (please describe): Weakness of right leg and numbness of right foot  Current Decadron regimen, if applicable: No  Ambulatory status? Walker? Wheelchair?:   SAFETY ISSUES:  Prior radiation? No  Pacemaker/ICD? No   Possible current pregnancy? No  Is the patient on methotrexate? No  Current Complaints / other details:  Small right pleural effusion.                                                             She has lost 15 lbs in 4 weeks prior to 02/28/13 because of the discomfort associated with swallowing food.                                                            Nonproductive cough

## 2013-03-02 NOTE — Telephone Encounter (Signed)
I called [facility]. Was advised RX for percocet will have to be brought in by hand-can not be faxed in according to the pharmacists. Its a C2 medication. Please advise [facility] pt lives in [place]. thanks

## 2013-03-05 ENCOUNTER — Ambulatory Visit: Payer: BC Managed Care – PPO

## 2013-03-05 ENCOUNTER — Telehealth: Payer: Self-pay | Admitting: *Deleted

## 2013-03-05 ENCOUNTER — Ambulatory Visit: Payer: BC Managed Care – PPO | Admitting: Radiation Oncology

## 2013-03-05 ENCOUNTER — Ambulatory Visit
Admission: RE | Admit: 2013-03-05 | Discharge: 2013-03-05 | Disposition: A | Discharge status: Home / Self Care | Payer: BC Managed Care – PPO | Source: Ambulatory Visit | Attending: Radiation Oncology | Admitting: Radiation Oncology

## 2013-03-05 NOTE — Telephone Encounter (Signed)
Left vm message regarding appt for [facility]oc 03/08/13 at 2:00 arrive at 1:45.

## 2013-03-05 NOTE — Progress Notes (Signed)
This patient no-showed for today's urgent consultation. So far, my staff has not been able to reach her by phone to reschedule.  -----------------------------------  Lonie Peak, MD

## 2013-03-06 ENCOUNTER — Ambulatory Visit
Admission: RE | Admit: 2013-03-06 | Discharge: 2013-03-06 | Disposition: A | Discharge status: Home / Self Care | Payer: BC Managed Care – PPO | Source: Ambulatory Visit | Attending: Radiation Oncology | Admitting: Radiation Oncology

## 2013-03-06 ENCOUNTER — Encounter: Payer: Self-pay | Admitting: *Deleted

## 2013-03-06 ENCOUNTER — Telehealth: Payer: Self-pay | Admitting: *Deleted

## 2013-03-06 ENCOUNTER — Encounter: Payer: Self-pay | Admitting: Radiation Oncology

## 2013-03-06 ENCOUNTER — Telehealth: Payer: Self-pay | Admitting: Internal Medicine

## 2013-03-06 ENCOUNTER — Other Ambulatory Visit: Payer: BC Managed Care – PPO

## 2013-03-06 VITALS — BP 125/79 | HR 104 | Temp 98.0°F | Ht 61.5 in | Wt 102.2 lb

## 2013-03-06 DIAGNOSIS — R1310 Dysphagia, unspecified: Secondary | ICD-10-CM | POA: Insufficient documentation

## 2013-03-06 DIAGNOSIS — C7A1 Malignant poorly differentiated neuroendocrine tumors: Secondary | ICD-10-CM | POA: Insufficient documentation

## 2013-03-06 DIAGNOSIS — B370 Candidal stomatitis: Secondary | ICD-10-CM | POA: Insufficient documentation

## 2013-03-06 DIAGNOSIS — C7A090 Malignant carcinoid tumor of the bronchus and lung: Secondary | ICD-10-CM | POA: Insufficient documentation

## 2013-03-06 DIAGNOSIS — R918 Other nonspecific abnormal finding of lung field: Secondary | ICD-10-CM

## 2013-03-06 DIAGNOSIS — C342 Malignant neoplasm of middle lobe, bronchus or lung: Secondary | ICD-10-CM

## 2013-03-06 DIAGNOSIS — F17200 Nicotine dependence, unspecified, uncomplicated: Secondary | ICD-10-CM | POA: Insufficient documentation

## 2013-03-06 DIAGNOSIS — C7951 Secondary malignant neoplasm of bone: Secondary | ICD-10-CM | POA: Insufficient documentation

## 2013-03-06 DIAGNOSIS — C787 Secondary malignant neoplasm of liver and intrahepatic bile duct: Secondary | ICD-10-CM | POA: Insufficient documentation

## 2013-03-06 DIAGNOSIS — Z510 Encounter for antineoplastic radiation therapy: Secondary | ICD-10-CM | POA: Insufficient documentation

## 2013-03-06 DIAGNOSIS — IMO0002 Reserved for concepts with insufficient information to code with codable children: Secondary | ICD-10-CM | POA: Insufficient documentation

## 2013-03-06 DIAGNOSIS — Z79899 Other long term (current) drug therapy: Secondary | ICD-10-CM | POA: Insufficient documentation

## 2013-03-06 DIAGNOSIS — C7952 Secondary malignant neoplasm of bone marrow: Secondary | ICD-10-CM

## 2013-03-06 DIAGNOSIS — C7B8 Other secondary neuroendocrine tumors: Secondary | ICD-10-CM | POA: Insufficient documentation

## 2013-03-06 HISTORY — DX: Localized swelling, mass and lump, trunk: R22.2

## 2013-03-06 HISTORY — DX: Sacrococcygeal disorders, not elsewhere classified: M53.3

## 2013-03-06 MED ORDER — OXYCODONE HCL 5 MG PO TABS: 5.0000 mg | ORAL_TABLET | ORAL | Status: DC | PRN

## 2013-03-06 MED ORDER — DEXAMETHASONE 4 MG PO TABS: 4.0000 mg | ORAL_TABLET | Freq: Two times a day (BID) | ORAL | Status: DC

## 2013-03-06 MED ORDER — OXYCODONE-ACETAMINOPHEN 5-325 MG PO TABS
1.0000 | ORAL_TABLET | ORAL | Status: AC
  Administered 2013-03-06: 1 via ORAL
  Filled 2013-03-06: qty 1

## 2013-03-06 NOTE — Progress Notes (Signed)
Spoke with patient and daughter at [employer] today.  Information on lung cancer given and explained.  She is having SIM today and no questions at this time

## 2013-03-06 NOTE — Progress Notes (Addendum)
  Radiation Oncology         (336) [phone] ________________________________  Name: Kristina Fischer MRN: [id number]  Date: 03/06/2013  DOB: Feb 29, 1951  SIMULATION AND TREATMENT PLANNING NOTE  Outpatient  DIAGNOSIS:  Metastatic neuroendocrine lung cancer  NARRATIVE:  The patient was brought to the CT Simulation planning suite.  Identity was confirmed.  All relevant records and images related to the planned course of therapy were reviewed.  The patient freely provided informed written consent to proceed with treatment after reviewing the details related to the planned course of therapy. The consent form was witnessed and verified by the simulation staff.    Then, the patient was set-up in a stable reproducible  supine position for radiation therapy. Lower body in vaclock.  CT images were obtained.  Surface markings were placed.  The CT images were loaded into the planning software.    TREATMENT PLANNING NOTE: Treatment planning then occurred.  The radiation prescription was entered and confirmed.    A total of 7 medically necessary complex treatment devices were fabricated and supervised by me - 6  fields with MLCs for blocks against bowel, lungs, heart as well as a vaclock. I have requested : Isodose Plan/dose calc's.     The patient will receive 30Gy in 10 fractions with an AP/PA arrangement to her Right hilar/subcarinal and Right middle/lower lung disease.  30Gy/10 fractions with a 3 field plan to her sacral disease from L5-S4.  Treatment to start in about 48 hours. She is now on Dexamethasone.   -----------------------------------  Lonie Peak, MD

## 2013-03-06 NOTE — Telephone Encounter (Signed)
C/D 03/06/13 for appt. 03/08/13 °

## 2013-03-06 NOTE — Progress Notes (Signed)
Radiation Oncology         (336) [phone] ________________________________  Initial outpatient Consultation  Name: Kristina Fischer MRN: [id number]  Date: 03/06/2013  DOB: 23-Feb-1951  CC:No PCP Per Patient  Clance, Kristina Krabbe, MD   REFERRING PHYSICIAN: Shelle Iron Kristina Krabbe, MD  DIAGNOSIS: Neuroendocrine Carcinoma of the Right Lung, Small Cell Type  HISTORY OF PRESENT ILLNESS::Kristina Fischer is a 62 y.o. female who has a longstanding smoking history and lost about 15 lbs over the past 2 months.  She also had developed the sensation of food sticking in her lower esophagus and right Leg pain like a "toothache" associated with right foot numbness. Possibly some right leg weakness.  She has also has right buttock/sacral pain.  She was found by chest CT on 02/26/13 to have bulky mediastinal/right hilar nodal mass with mass like consolidation of this RML and post obstructive changes in the RLL.  Necrotic node associated with pericardium. Right pleural effusion (small.)  Mets to Liver, adrenal glands, and sacrum, as well as porta hepatis adenopathy.   MRI of lumbar spine showed (on 03/02/13) Bony metastatic disease with large right sacral lesion that  shows extraosseous extension into the right sided neural foramina  from L5-S1 through S3-S4 and right side of the sacral spinal canal.  Metastatic infiltration of the left sacral ala is present as well,  although to a lesser extent. Tumor encases the right sacral  nerves.  2. Medial left iliac bone metastatic disease with extraosseous  extension to the left iliopsoas muscle.  She is status post sacral biopsy  On 6/5 showing  Bone, biopsy, right sacral - HIGHLY POORLY DIFFERENTIATED NEUROENDOCRINE CARCINOMA, SMALL CELL TYPE, SEE COMMENT.  No MRI of brain or PET scan thus far; due to see [facility] physicians on Thurs 6/12. She does have some modest headaches and nausea, but not visual changes or other neurological complaints.   PREVIOUS RADIATION THERAPY:  NO  PAST MEDICAL HISTORY:  has a past medical history of Arthritis; Sciatica; COPD (chronic obstructive pulmonary disease); and Sacral mass.    PAST SURGICAL HISTORY: Past Surgical History  Procedure Laterality Date  . Abdominal hysterectomy    . Rhinoplasty      FAMILY HISTORY: family history includes Cancer in her brother and father; Lung cancer in her brother; and Ovarian cancer in her sister.  SOCIAL HISTORY:  reports that she has been smoking Cigarettes.  She has been smoking about 1.00 pack per day. She does not have any smokeless tobacco history on file. She reports that  drinks alcohol. She reports that she does not use illicit drugs.  ALLERGIES: Review of patient's allergies indicates no known allergies.  MEDICATIONS:  Current Outpatient Prescriptions  Medication Sig Dispense Refill  . albuterol (PROVENTIL HFA;VENTOLIN HFA) 108 (90 BASE) MCG/ACT inhaler Inhale 1-2 puffs into the lungs every 6 (six) hours as needed for wheezing.  1 Inhaler  0  . dexamethasone (DECADRON) 4 MG tablet Take 1 tablet (4 mg total) by mouth 2 (two) times daily with a meal.  40 tablet  0  . HYDROcodone-acetaminophen (NORCO/VICODIN) 5-325 MG per tablet Take 1-2 tablets by mouth every 6 (six) hours as needed for pain.  40 tablet  0  . OVER THE COUNTER MEDICATION Take 2 capsules by mouth daily. Natural Supplement from health food store for arthritis.      Marland Kitchen oxyCODONE (OXY IR/ROXICODONE) 5 MG immediate release tablet Take 1 tablet (5 mg total) by mouth every 3 (three) hours as needed for  pain.  60 tablet  0   No current facility-administered medications for this encounter.    REVIEW OF SYSTEMS:    Pertinent items are noted in HPI.   PHYSICAL EXAM:  height is 5' 1.5" (1.562 m) and weight is 102 lb 3.2 oz (46.358 kg). Her temperature is 98 F (36.7 C). Her blood pressure is 125/79 and her pulse is 104.   General: Alert and oriented, in no acute distress HEENT: Head is normocephalic. Pupils are equally  round and reactive to light. Extraocular movements are intact. Oropharynx is clear. Neck: Neck is supple, no palpable cervical or supraclavicular lymphadenopathy. Heart: Regular in rate and rhythm with no murmurs, rubs, or gallops. Chest: Decreased RLL sounds Abdomen:Firm RUQ with periumbilical tenderness Extremities: No cyanosis or edema. Lymphatics: No concerning lymphadenopathy. Skin: No concerning lesions. Musculoskeletal: symmetric strength and muscle tone throughout. Pain concentrated in R buttock currently Neurologic: Cranial nerves II through XII are grossly intact. No obvious focalities. Speech is fluent. Coordination is intact. Decreased sensation - Right foot Psychiatric: Judgment and insight are intact. Affect is appropriate.    LABORATORY DATA:  Lab Results  Component Value Date   WBC 9.2 03/01/2013   HGB 13.4 03/01/2013   HCT 39.4 03/01/2013   MCV 89.1 03/01/2013   PLT 474* 03/01/2013   CMP     Component Value Date/Time   NA 139 02/26/2013 0927   K 3.9 02/26/2013 0927   CL 98 02/26/2013 0927   CO2 30 02/26/2013 0927   GLUCOSE 110* 02/26/2013 0927   BUN 9 02/26/2013 0927   CREATININE 0.73 02/26/2013 0927   CALCIUM 9.2 02/26/2013 0927   PROT 6.9 02/26/2013 0927   ALBUMIN 2.8* 02/26/2013 0927   AST 27 02/26/2013 0927   ALT 18 02/26/2013 0927   ALKPHOS 105 02/26/2013 0927   BILITOT 0.2* 02/26/2013 0927   GFRNONAA >90 02/26/2013 0927   GFRAA >90 02/26/2013 0927         RADIOGRAPHY: Dg Chest 2 View  02/26/2013   *RADIOLOGY REPORT*  Clinical Data: Dysphagia  CHEST - 2 VIEW  Comparison: August 17, 2005.  Findings: Hyperexpansion of the lungs is again noted consistent with chronic obstructive pulmonary disease.  Linear density is noted in left mid lung most consistent with scarring or subsegmental atelectasis.  There appears to be atelectasis of the right middle lobe with associated pleural effusion which is new since prior exam.  IMPRESSION: Atelectasis or pneumonia of right middle lobe is noted with  associated pleural effusion; clinical correlation is recommended to rule out central endobronchial obstructive lesion or neoplasm.   Original Report Authenticated By: Lupita Raider.,  M.D.   Ct Chest W Contrast  02/26/2013   *RADIOLOGY REPORT*  Clinical Data:  Abnormal chest radiograph epigastric pain and weight loss.  CT CHEST, ABDOMEN AND PELVIS WITH CONTRAST  Technique:  Multidetector CT imaging of the chest, abdomen and pelvis was performed following the standard protocol during bolus administration of intravenous contrast.  Contrast: OMNIPAQUE IOHEXOL 300 MG/ML  SOLN  Comparison:  Chest radiograph 02/26/2013 cough.  CT CHEST  Findings:  Low right paratracheal lymph node measures 2.1 cm and appears necrotic.  A large nodal mass occupies the subcarinal and right hilar regions, measuring approximately 5.8 x 7.5 cm. Narrowing of the right upper and right middle lobe pulmonary arteries in association.  Mass effect on the left atrium. A necrotic appearing lymph node intimately associated with the pericardial border measures 1.5 x 1.6 cm (image 39). Heart  size normal.  No pericardial effusion.  Marked narrowing of the anterior segmental bronchus of the right upper lobe with complete obstruction of the right middle and right lower lobe bronchi.  Mass-like consolidation in the right middle lobe.  Peribronchovascular nodularity and airspace consolidation in the right lower lobe.  Small right pleural effusion.  Possible pleural thickening in the upper medial right hemithorax and is image 9). Calcified pleural plaques in the left hemithorax.  IMPRESSION:  1.  Bulky mediastinal/right hilar nodal mass with mass-like consolidation in the right middle lobe and postobstructive pneumonitis/consolidation in the right lower lobe. Please see additional findings discussion below. 2.  Small right pleural effusion, possibly malignant. 3.  Necrotic appearing lymph node intimately associated with the pericardium.  CT ABDOMEN  AND PELVIS  Findings:  Heterogeneous low attenuation lesions in the right hepatic lobe measure up to 1.7 x 1.8 cm (image 60).  A necrotic appearing nodal mass in the porta hepatis measures 3.0 x 3.8 cm. Narrowing and probable invasion of the main portal vein in association (example image 62).  Gallbladder is unremarkable.  A lesion in the right adrenal gland measures 1.2 x 1.8 cm.  Left adrenal lesion measures 0.9 x 1.5 cm.  Kidneys, spleen, pancreas, stomach and bowel are unremarkable.  A 2 mm nodule is seen in the posterior left lower quadrant, adjacent to the descending colon (image 79).  Similarly, a subcutaneous nodule just superior to the left iliac crest measures 1.0 x 1.3 cm (image 84).  Atherosclerotic calcification of the arterial vasculature without abdominal aortic aneurysm.  No pathologically enlarged retroperitoneal or mesenteric lymph nodes.  No free fluid.  Lytic expansion is seen in the right sacrum with associated fracture (coronal image 51).  Sclerotic lesions are seen in the right iliac wing and T3 vertebral body and are not necessarily metastatic as bone islands would appear similar.  IMPRESSION:  1.  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  Additional soft tissue nodules in the left lower quadrant and in the subcutaneous fat overlying the left iliac crest.  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 2.  Necrotic appearing porta hepatis adenopathy with probable invasion of the adjacent portal vein. 3.  Pathologic right sacral fracture.   Original Report Authenticated By: Leanna Battles, M.D.   Mr Lumbar Spine W Wo Contrast  03/02/2013   *RADIOLOGY REPORT*  Clinical Data: Metastatic cancer of unknown primary.  Biopsy earlier today.  Pain in the right leg and foot.  MRI LUMBAR SPINE WITHOUT AND WITH CONTRAST  Technique:  Multiplanar and multiecho pulse  sequences of the lumbar spine were obtained without and with intravenous contrast.  Contrast: 9mL MULTIHANCE GADOBENATE DIMEGLUMINE 529 MG/ML IV SOLN  Comparison: CT chest abdomen and pelvis 02/26/2013.  Findings: Known sacral metastatic disease is again identified. Large masses in the right sacral ala.  This shows some central nonenhancing tissue and central necrosis.  There is extraosseous extension dorsal to the inferior endplate of L5 and diffusely dorsal to the left S1-S3 segments.  Invasion of the neural foramina on the right at L5-S1 through S3-S4. Left sacral hila metastatic disease also present. Metastasis is present in the medial left iliac bone with effacement of normal fatty marrow.  There is also with extraosseous extension from the left iliac bone disease into the left iliopsoas muscle. There are no lumbar compression fractures.  There is a mild levoconvex  curve with the apex at L4. Spinal cord terminates posterior to the L1 vertebra. Bone marrow signal shows suppression of fatty marrow, which is a nonspecific finding, commonly associated with anemia, chronic disease, cigarette smoking, and obesity.  Paraspinal soft tissue assessment is deferred to recent CT.  Benign vertebral body hemangioma present at L1.  T11-T12 through L1-L2 discs appear normal. Post-gadolinium imaging shows no convincing evidence of intrathecal metastatic disease.  L2-L3:  Disc desiccation and mild bulging in both neural foramina without significant stenosis.  L3-L4:  Normal.  L4-L5:  Disc desiccation.  Small central annular tear and broad- based bulge.  Mild left foraminal stenosis associated with degenerative disease and left foraminal bulging.  Central canal appears patent.  Minimal encroachment on the left lateral recess of doubtful clinical significance.  L5-S1:  Extraosseous extension of tumor from the right sacral ala into the right lateral recess and right neural foramen.  Invasion of tumor from the sacrum and the right  piriformis muscle partially visualized.  IMPRESSION: 1.  Bony metastatic disease with large right sacral lesion that shows extraosseous extension into the right sided neural foramina from L5-S1 through S3-S4 and right side of the sacral spinal canal. Metastatic infiltration of the left sacral ala is present as well, although to a lesser extent.  Tumor encases the right sacral nerves. 2.  Medial left iliac bone metastatic disease with extraosseous extension to the left iliopsoas muscle. 3.  Mild lumbar spine degenerative disease most pronounced at L4- L5. 4.  No pathologic compression fracture of the lumbar spine.   Original Report Authenticated By: Andreas Newport, M.D.   Ct Abdomen Pelvis W Contrast  02/26/2013   *RADIOLOGY REPORT*  Clinical Data:  Abnormal chest radiograph epigastric pain and weight loss.  CT CHEST, ABDOMEN AND PELVIS WITH CONTRAST  Technique:  Multidetector CT imaging of the chest, abdomen and pelvis was performed following the standard protocol during bolus administration of intravenous contrast.  Contrast: OMNIPAQUE IOHEXOL 300 MG/ML  SOLN  Comparison:  Chest radiograph 02/26/2013 cough.  CT CHEST  Findings:  Low right paratracheal lymph node measures 2.1 cm and appears necrotic.  A large nodal mass occupies the subcarinal and right hilar regions, measuring approximately 5.8 x 7.5 cm. Narrowing of the right upper and right middle lobe pulmonary arteries in association.  Mass effect on the left atrium. A necrotic appearing lymph node intimately associated with the pericardial border measures 1.5 x 1.6 cm (image 39). Heart size normal.  No pericardial effusion.  Marked narrowing of the anterior segmental bronchus of the right upper lobe with complete obstruction of the right middle and right lower lobe bronchi.  Mass-like consolidation in the right middle lobe.  Peribronchovascular nodularity and airspace consolidation in the right lower lobe.  Small right pleural effusion.  Possible  pleural thickening in the upper medial right hemithorax and is image 9). Calcified pleural plaques in the left hemithorax.  IMPRESSION:  1.  Bulky mediastinal/right hilar nodal mass with mass-like consolidation in the right middle lobe and postobstructive pneumonitis/consolidation in the right lower lobe. Please see additional findings discussion below. 2.  Small right pleural effusion, possibly malignant. 3.  Necrotic appearing lymph node intimately associated with the pericardium.  CT ABDOMEN AND PELVIS  Findings:  Heterogeneous low attenuation lesions in the right hepatic lobe measure up to 1.7 x 1.8 cm (image 60).  A necrotic appearing nodal mass in the porta hepatis measures 3.0 x 3.8 cm. Narrowing and probable invasion of the main portal vein  in association (example image 62).  Gallbladder is unremarkable.  A lesion in the right adrenal gland measures 1.2 x 1.8 cm.  Left adrenal lesion measures 0.9 x 1.5 cm.  Kidneys, spleen, pancreas, stomach and bowel are unremarkable.  A 2 mm nodule is seen in the posterior left lower quadrant, adjacent to the descending colon (image 79).  Similarly, a subcutaneous nodule just superior to the left iliac crest measures 1.0 x 1.3 cm (image 84).  Atherosclerotic calcification of the arterial vasculature without abdominal aortic aneurysm.  No pathologically enlarged retroperitoneal or mesenteric lymph nodes.  No free fluid.  Lytic expansion is seen in the right sacrum with associated fracture (coronal image 51).  Sclerotic lesions are seen in the right iliac wing and T3 vertebral body and are not necessarily metastatic as bone islands would appear similar.  IMPRESSION:  1.  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  Additional soft tissue nodules in the left lower quadrant and in the subcutaneous fat overlying the left iliac crest.  Collectively and with findings in the chest detailed above, findings are most consistent with  stage IV primary bronchogenic carcinoma. 2.  Necrotic appearing porta hepatis adenopathy with probable invasion of the adjacent portal vein. 3.  Pathologic right sacral fracture.   Original Report Authenticated By: Leanna Battles, M.D.   US Abdomen Limited  03/01/2013   *RADIOLOGY REPORT*  Clinical Data: Multiple lesions worrisome for metastatic disease.  ABDOMEN ULTRASOUND LIMITED  Technique: Scanning in preparation for liver biopsy was performed.  Comparison:  None.  Findings: The lesion within the liver in question is towards the dome and difficult to approach by ultrasound.  IMPRESSION: The liver lesion is identified but difficult to approach.  The patient will be biopsied in CT.   Original Report Authenticated By: Jolaine Click, M.D.   Ct Biopsy  03/01/2013   *RADIOLOGY REPORT*  Clinical Data/Indication: METASTATIC DISEASE  CT-GUIDED BIOPSY right sacral biopsy  Sedation: Versed 2.0 mg, Fentanyl 150 mcg.  Total Moderate Sedation Time: 20 minutes.  Procedure: The procedure, risks, benefits, and alternatives were explained to the patient. Questions regarding the procedure were encouraged and answered. The patient understands and consents to the procedure.  The low back was prepped with betadine in a sterile fashion, and a sterile drape was applied covering the operative field. A sterile gown and sterile gloves were used for the procedure.  Under CT guidance, a 17 gauge needle was inserted into the right sacral lesion.  Five 18 gauge core biopsies were obtained. Final imaging was performed.  Patient tolerated the procedure well without complication.  Vital sign monitoring by nursing staff during the procedure will continue as patient is in the special procedures unit for post procedure observation.  Findings: The images document guide needle placement within the right sacral lesion.  Post biopsy images demonstrate no evidence of hemorrhage.  IMPRESSION: Successful CT-guided right sacral lesion biopsy.   Original  Report Authenticated By: Jolaine Click, M.D.      IMPRESSION/PLAN: [age] yo woman with new dx of Stage IV Neuroendocrine Lung cancer, Small Cell type.  I recommend the following  1) we will order a brain MRI for staging. I will defer to med/onc as to whether to order a PET  2) Start Dexamethasone 4mg  BID for numbness of foot, subjective modest weakness of right leg, and pain  3) Nutrition referral for 15lb wt loss  4) Oxycodone Rx for pain (pt prefers to hydrocodone)  5) Simulate today for palliative RT to main tumor in R hilum, central mediastinum, RML/RLL for dysphagia; Also, will plan RT to sacral disease.  Anticipate 30Gy/10 fractions. Will expedite start of treatment.  Consent form was signed today; patient is enthusiastic to proceed.  She understands her cancer is not curable.  Most common side effects of RT would include fatigue, skin irritation, esophagitis - as discussed today.  She will meet our patient navigator later today to solidify understanding of future plans.  I spent 45 minutes minutes face to face with the patient and more than 50% of that time was spent in counseling and/or coordination of care.    __________________________________________   Lonie Peak, MD

## 2013-03-06 NOTE — Progress Notes (Signed)
Called patient's insurance carrier to find out about coverage.  Spoke to [employer] [place],  No pre-cert req'd for op rad tx; based on medical necessity.  Plan has $600 deductible (currently met) and 20% coinsurance which both apply to the OOP max of $3000 (currently met $883.63).

## 2013-03-06 NOTE — Telephone Encounter (Signed)
Spoke with pt regarding [facility] appt 03/08/13 at 2:00 and Rad Onc, Dr. Basilio Cairo appt 03/09/13 at 2:00 arrive at 1:45.  She verbalized understanding of time and place of appt

## 2013-03-06 NOTE — Progress Notes (Signed)
Kristina Fischer c/o pain in her right buttock and right posterior thigh region.   She was given 1 percocet 5/325 mg tab for pain level as a 5-6 on a scale of 0-10.  Time 1432.    On simulation table now at 1532 and she grades her pain level as a 3 on a scale of 0-10.

## 2013-03-07 ENCOUNTER — Telehealth: Payer: Self-pay | Admitting: *Deleted

## 2013-03-07 DIAGNOSIS — C342 Malignant neoplasm of middle lobe, bronchus or lung: Secondary | ICD-10-CM | POA: Insufficient documentation

## 2013-03-07 NOTE — Telephone Encounter (Signed)
CALLED PATIENT TO INFORM OF TEST, LVM FOR A RETURN CALL 

## 2013-03-08 ENCOUNTER — Ambulatory Visit
Admission: RE | Admit: 2013-03-08 | Discharge: 2013-03-08 | Disposition: A | Discharge status: Home / Self Care | Payer: BC Managed Care – PPO | Source: Ambulatory Visit | Attending: Radiation Oncology | Admitting: Radiation Oncology

## 2013-03-08 ENCOUNTER — Other Ambulatory Visit: Payer: Self-pay | Admitting: Radiology

## 2013-03-08 ENCOUNTER — Encounter: Payer: Self-pay | Admitting: *Deleted

## 2013-03-08 ENCOUNTER — Telehealth: Payer: Self-pay | Admitting: *Deleted

## 2013-03-08 ENCOUNTER — Telehealth: Payer: Self-pay | Admitting: Internal Medicine

## 2013-03-08 ENCOUNTER — Ambulatory Visit (HOSPITAL_BASED_OUTPATIENT_CLINIC_OR_DEPARTMENT_OTHER): Payer: BC Managed Care – PPO | Admitting: Internal Medicine

## 2013-03-08 ENCOUNTER — Encounter: Payer: Self-pay | Admitting: Radiation Oncology

## 2013-03-08 VITALS — BP 133/87 | HR 114 | Temp 98.0°F | Resp 18 | Ht 63.0 in | Wt 100.7 lb

## 2013-03-08 DIAGNOSIS — C3491 Malignant neoplasm of unspecified part of right bronchus or lung: Secondary | ICD-10-CM

## 2013-03-08 DIAGNOSIS — C787 Secondary malignant neoplasm of liver and intrahepatic bile duct: Secondary | ICD-10-CM

## 2013-03-08 DIAGNOSIS — C342 Malignant neoplasm of middle lobe, bronchus or lung: Secondary | ICD-10-CM

## 2013-03-08 DIAGNOSIS — C3490 Malignant neoplasm of unspecified part of unspecified bronchus or lung: Secondary | ICD-10-CM | POA: Insufficient documentation

## 2013-03-08 DIAGNOSIS — C7970 Secondary malignant neoplasm of unspecified adrenal gland: Secondary | ICD-10-CM

## 2013-03-08 MED ORDER — LIDOCAINE-PRILOCAINE 2.5-2.5 % EX CREA: TOPICAL_CREAM | CUTANEOUS | Status: AC | PRN

## 2013-03-08 MED ORDER — PROCHLORPERAZINE MALEATE 10 MG PO TABS: 10.0000 mg | ORAL_TABLET | Freq: Four times a day (QID) | ORAL | Status: AC | PRN

## 2013-03-08 NOTE — Progress Notes (Addendum)
[facility] Clinical Social Work  Clinical Social Work met with patient at [employer] today.  CSW briefly discussed support services and CSW role at [facility].  CSW encouraged patient to call with any questions or concerns.  Patient scored a 9 on the DT, patient states feeling much more at ease after discussing plan with MD.  Kathrin Penner, MSW, LCSW Clinical Social Worker [facility] [phone]

## 2013-03-08 NOTE — Progress Notes (Signed)
[facility] [phone]   [phone] Multidisciplinary Thoracic Oncology Clinic [facility])  CONSULT NOTE  REFERRING PHYSICIAN: Dr. Marcelyn Bruins  REASON FOR CONSULTATION:  62 years old white female recently diagnosed with lung cancer.  HPI Kristina Fischer is a 62 y.o. female was past medical history significant for COPD, sciatica as well as rheumatoid arthritis and long history of smoking. The patient mentions that over the last few weeks she has been complaining of pain in the buttock and right leg and was getting worse when she drives to and from work. She was seen by her primary care physician and treated with pain medication in the form of tramadol and Advil with some improvement. Her condition is started getting worse and the patient also had weight loss in addition to dysphagia and right leg weakness. She was seen at the emergency Department on 02/26/2013 and chest x-ray was performed and it showed atelectasis or pneumonia of the right middle lobe with associated pleural effusion. This was followed by CT scan of the chest on the same date and it showed Low right paratracheal lymph node measures 2.1 cm and appears necrotic. A large nodal mass occupies the subcarinal and right hilar regions, measuring approximately 5.8 x 7.5 cm. Narrowing of the right upper and right middle lobe pulmonary arteries in association. Mass effect on the left atrium. A necrotic appearing lymph node intimately associated with the pericardial border measures 1.5 x 1.6 cm. Marked narrowing of the anterior segmental bronchus of the right upper lobe with complete obstruction of the right middle and right lower lobe bronchi. Mass-like consolidation in the right middle lobe. Peribronchovascular nodularity and airspace consolidation in the right lower lobe. Small right pleural effusion. Possible pleural thickening in the upper medial right hemithorax. CT of the abdomen and pelvis on the same  day showed Metastatic disease involving the liver adrenal glands, porta hepatis lymph nodes, and sacrum. Additional soft tissue nodules in the left lower quadrant and in the subcutaneous fat overlying the left iliac crest. Collectively and with findings in the chest, findings are most consistent with stage IV primary bronchogenic carcinoma. Necrotic appearing porta hepatis adenopathy with probable invasion of the adjacent portal vein. Pathologic right sacral fracture. On 03/01/2013 the patient underwent CT-guided biopsy of the right sacral mass.  The final pathology (Accession: [id number]) showed highly poorly differentiated neuroendocrine carcinoma, small cell type. The metastatic tumor demonstrates the following immunophenotype:Cytokeratin AE1/AE3 - patchy moderate strong expression. CD56 - diffuses, strong expression. Chromogranin - diffuse, weak moderate expression. TTF-1 - patchy weak moderate expression. 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 The patient was seen by Dr. Basilio Cairo for consideration of palliative radiotherapy to the sacral lesion and she had MRI of the lumbar spine performed on 03/02/2013 and it showed Bony metastatic disease with large right sacral lesion that shows extraosseous extension into the right sided neural foramina from L5-S1 through S3-S4 and right side of the sacral spinal canal. Metastatic infiltration of the left sacral ala is present as well, although to a lesser extent. Tumor encases the right sacral nerves. Medial left iliac bone metastatic disease with extraosseous extension to the left iliopsoas muscle. She was started on Decadron and premedication by Dr. Basilio Cairo and feels a little bit better. She also started palliative radiotherapy to the sacral area. The patient was referred to me today for further evaluation and recommendation regarding treatment of her  recently diagnosed with small cell lung cancer. She continues to complain of shortness breath but no significant chest pain, cough or hemoptysis. She had weight loss of around 12 pounds over the last month. She also complained of dull headache but no blurred vision. Her family history significant for 2 brothers with lung cancer, sister with ovarian cancer, father with cancer of unknown type, and mother with hypertension. The patient is divorced and she has one biologic son and one stepdaughter. 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 She has a history of smoking one half pack per day for around 46 years and unfortunately she continues to smoke and I strongly encouraged her to quit smoking and offered her smoke cessation program. She also drinks on daily basis and I also advised her to quit drinking especially during her treatment.     @SFHPI @  Past Medical History  Diagnosis Date  . Arthritis   . Sciatica   . COPD (chronic obstructive pulmonary disease)   . Sacral mass     Past Surgical History  Procedure Laterality Date  . Abdominal hysterectomy    . Rhinoplasty      Family History  Problem Relation Age of Onset  . Lung cancer Brother   . Ovarian cancer Sister   . Cancer Brother     face  . Cancer Father     Unknown type    Social History History  Substance Use Topics  . Smoking status: Current Every Day Smoker -- 1.00 packs/day    Types: Cigarettes  . Smokeless tobacco: Not on file  . Alcohol Use: Yes    No Known Allergies  Current Outpatient Prescriptions  Medication Sig Dispense Refill  . albuterol (PROVENTIL HFA;VENTOLIN HFA) 108 (90 BASE) MCG/ACT inhaler Inhale 1-2 puffs into the lungs every 6 (six) hours as needed for wheezing.  1 Inhaler  0  . dexamethasone (DECADRON) 4 MG tablet Take 1 tablet (4 mg total) by mouth 2 (two) times daily with a meal.  40 tablet  0  . HYDROcodone-acetaminophen (NORCO/VICODIN) 5-325 MG per tablet Take 1-2 tablets by  mouth every 6 (six) hours as needed for pain.  40 tablet  0  . OVER THE COUNTER MEDICATION Take 2 capsules by mouth daily. Natural Supplement from health food store for arthritis.      Marland Kitchen oxyCODONE (OXY IR/ROXICODONE) 5 MG immediate release tablet Take 1 tablet (5 mg total) by mouth every 3 (three) hours as needed for pain.  60 tablet  0   No current facility-administered medications for this visit.    Review of Systems  A comprehensive review of systems was negative except for: Constitutional: positive for fatigue and weight loss Respiratory: positive for dyspnea on exertion Neurological: positive for headaches  Physical Exam  HYQ:MVHQI, healthy, no distress, cooperative and malnourished SKIN: skin color, texture, turgor are normal HEAD: Normocephalic, No masses, lesions, tenderness or abnormalities EYES: normal, PERRLA EARS: External ears normal OROPHARYNX:no exudate and no erythema  NECK: supple, no adenopathy LYMPH:  no palpable lymphadenopathy, no hepatosplenomegaly BREAST:not examined LUNGS: clear to auscultation  HEART: regular rate & rhythm and no murmurs ABDOMEN:abdomen soft, non-tender, normal bowel sounds and no masses or organomegaly BACK: Back symmetric, no curvature., tender to palpation at the sacral area.  EXTREMITIES:no joint deformities, effusion, or inflammation, no edema, no skin discoloration  NEURO: alert & oriented x 3 with fluent speech, no focal motor/sensory deficits  PERFORMANCE STATUS: ECOG 1  LABORATORY DATA: Lab Results  Component Value Date  WBC 9.2 03/01/2013   HGB 13.4 03/01/2013   HCT 39.4 03/01/2013   MCV 89.1 03/01/2013   PLT 474* 03/01/2013      Chemistry      Component Value Date/Time   NA 139 02/26/2013 0927   K 3.9 02/26/2013 0927   CL 98 02/26/2013 0927   CO2 30 02/26/2013 0927   BUN 9 02/26/2013 0927   CREATININE 0.73 02/26/2013 0927      Component Value Date/Time   CALCIUM 9.2 02/26/2013 0927   ALKPHOS 105 02/26/2013 0927   AST 27 02/26/2013 0927    ALT 18 02/26/2013 0927   BILITOT 0.2* 02/26/2013 0927       RADIOGRAPHIC STUDIES: Dg Chest 2 View  02/26/2013   *RADIOLOGY REPORT*  Clinical Data: Dysphagia  CHEST - 2 VIEW  Comparison: August 17, 2005.  Findings: Hyperexpansion of the lungs is again noted consistent with chronic obstructive pulmonary disease.  Linear density is noted in left mid lung most consistent with scarring or subsegmental atelectasis.  There appears to be atelectasis of the right middle lobe with associated pleural effusion which is new since prior exam.  IMPRESSION: Atelectasis or pneumonia of right middle lobe is noted with associated pleural effusion; clinical correlation is recommended to rule out central endobronchial obstructive lesion or neoplasm.   Original Report Authenticated By: Lupita Raider.,  M.D.   Ct Chest W Contrast  02/26/2013   *RADIOLOGY REPORT*  Clinical Data:  Abnormal chest radiograph epigastric pain and weight loss.  CT CHEST, ABDOMEN AND PELVIS WITH CONTRAST  Technique:  Multidetector CT imaging of the chest, abdomen and pelvis was performed following the standard protocol during bolus administration of intravenous contrast.  Contrast: OMNIPAQUE IOHEXOL 300 MG/ML  SOLN  Comparison:  Chest radiograph 02/26/2013 cough.  CT CHEST  Findings:  Low right paratracheal lymph node measures 2.1 cm and appears necrotic.  A large nodal mass occupies the subcarinal and right hilar regions, measuring approximately 5.8 x 7.5 cm. Narrowing of the right upper and right middle lobe pulmonary arteries in association.  Mass effect on the left atrium. A necrotic appearing lymph node intimately associated with the pericardial border measures 1.5 x 1.6 cm (image 39). Heart size normal.  No pericardial effusion.  Marked narrowing of the anterior segmental bronchus of the right upper lobe with complete obstruction of the right middle and right lower lobe bronchi.  Mass-like consolidation in the right middle lobe.   Peribronchovascular nodularity and airspace consolidation in the right lower lobe.  Small right pleural effusion.  Possible pleural thickening in the upper medial right hemithorax and is image 9). Calcified pleural plaques in the left hemithorax.  IMPRESSION:  1.  Bulky mediastinal/right hilar nodal mass with mass-like consolidation in the right middle lobe and postobstructive pneumonitis/consolidation in the right lower lobe. Please see additional findings discussion below. 2.  Small right pleural effusion, possibly malignant. 3.  Necrotic appearing lymph node intimately associated with the pericardium.  CT ABDOMEN AND PELVIS  Findings:  Heterogeneous low attenuation lesions in the right hepatic lobe measure up to 1.7 x 1.8 cm (image 60).  A necrotic appearing nodal mass in the porta hepatis measures 3.0 x 3.8 cm. Narrowing and probable invasion of the main portal vein in association (example image 62).  Gallbladder is unremarkable.  A lesion in the right adrenal gland measures 1.2 x 1.8 cm.  Left adrenal lesion measures 0.9 x 1.5 cm.  Kidneys, spleen, pancreas, stomach and bowel are unremarkable.  A 2 mm nodule is seen in the posterior left lower quadrant, adjacent to the descending colon (image 79).  Similarly, a subcutaneous nodule just superior to the left iliac crest measures 1.0 x 1.3 cm (image 84).  Atherosclerotic calcification of the arterial vasculature without abdominal aortic aneurysm.  No pathologically enlarged retroperitoneal or mesenteric lymph nodes.  No free fluid.  Lytic expansion is seen in the right sacrum with associated fracture (coronal image 51).  Sclerotic lesions are seen in the right iliac wing and T3 vertebral body and are not necessarily metastatic as bone islands would appear similar.  IMPRESSION:  1.  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  Additional soft tissue nodules in the left lower quadrant and in the subcutaneous fat  overlying the left iliac crest.  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 2.  Necrotic appearing porta hepatis adenopathy with probable invasion of the adjacent portal vein. 3.  Pathologic right sacral fracture.   Original Report Authenticated By: Leanna Battles, M.D.   Mr Lumbar Spine W Wo Contrast  03/02/2013   *RADIOLOGY REPORT*  Clinical Data: Metastatic cancer of unknown primary.  Biopsy earlier today.  Pain in the right leg and foot.  MRI LUMBAR SPINE WITHOUT AND WITH CONTRAST  Technique:  Multiplanar and multiecho pulse sequences of the lumbar spine were obtained without and with intravenous contrast.  Contrast: 9mL MULTIHANCE GADOBENATE DIMEGLUMINE 529 MG/ML IV SOLN  Comparison: CT chest abdomen and pelvis 02/26/2013.  Findings: Known sacral metastatic disease is again identified. Large masses in the right sacral ala.  This shows some central nonenhancing tissue and central necrosis.  There is extraosseous extension dorsal to the inferior endplate of L5 and diffusely dorsal to the left S1-S3 segments.  Invasion of the neural foramina on the right at L5-S1 through S3-S4. Left sacral hila metastatic disease also present. Metastasis is present in the medial left iliac bone with effacement of normal fatty marrow.  There is also with extraosseous extension from the left iliac bone disease into the left iliopsoas muscle. There are no lumbar compression fractures.  There is a mild levoconvex curve with the apex at L4. Spinal cord terminates posterior to the L1 vertebra. Bone marrow signal shows suppression of fatty marrow, which is a nonspecific finding, commonly associated with anemia, chronic disease, cigarette smoking, and obesity.  Paraspinal soft tissue assessment is deferred to recent CT.  Benign vertebral body hemangioma present at L1.  T11-T12 through L1-L2 discs appear normal. Post-gadolinium imaging shows no  convincing evidence of intrathecal metastatic disease.  L2-L3:  Disc desiccation and mild bulging in both neural foramina without significant stenosis.  L3-L4:  Normal.  L4-L5:  Disc desiccation.  Small central annular tear and broad- based bulge.  Mild left foraminal stenosis associated with degenerative disease and left foraminal bulging.  Central canal appears patent.  Minimal encroachment on the left lateral recess of doubtful clinical significance.  L5-S1:  Extraosseous extension of tumor from the right sacral ala into the right lateral recess and right neural foramen.  Invasion of tumor from the sacrum and the right piriformis muscle partially visualized.  IMPRESSION: 1.  Bony metastatic disease with large right sacral lesion that shows extraosseous extension into the right sided neural foramina from L5-S1 through S3-S4 and right side of the sacral spinal canal. Metastatic infiltration of the left sacral ala is present as well, although to a  lesser extent.  Tumor encases the right sacral nerves. 2.  Medial left iliac bone metastatic disease with extraosseous extension to the left iliopsoas muscle. 3.  Mild lumbar spine degenerative disease most pronounced at L4- L5. 4.  No pathologic compression fracture of the lumbar spine.   Original Report Authenticated By: Andreas Newport, M.D.   Ct Abdomen Pelvis W Contrast  02/26/2013   *RADIOLOGY REPORT*  Clinical Data:  Abnormal chest radiograph epigastric pain and weight loss.  CT CHEST, ABDOMEN AND PELVIS WITH CONTRAST  Technique:  Multidetector CT imaging of the chest, abdomen and pelvis was performed following the standard protocol during bolus administration of intravenous contrast.  Contrast: OMNIPAQUE IOHEXOL 300 MG/ML  SOLN  Comparison:  Chest radiograph 02/26/2013 cough.  CT CHEST  Findings:  Low right paratracheal lymph node measures 2.1 cm and appears necrotic.  A large nodal mass occupies the subcarinal and right hilar regions, measuring  approximately 5.8 x 7.5 cm. Narrowing of the right upper and right middle lobe pulmonary arteries in association.  Mass effect on the left atrium. A necrotic appearing lymph node intimately associated with the pericardial border measures 1.5 x 1.6 cm (image 39). Heart size normal.  No pericardial effusion.  Marked narrowing of the anterior segmental bronchus of the right upper lobe with complete obstruction of the right middle and right lower lobe bronchi.  Mass-like consolidation in the right middle lobe.  Peribronchovascular nodularity and airspace consolidation in the right lower lobe.  Small right pleural effusion.  Possible pleural thickening in the upper medial right hemithorax and is image 9). Calcified pleural plaques in the left hemithorax.  IMPRESSION:  1.  Bulky mediastinal/right hilar nodal mass with mass-like consolidation in the right middle lobe and postobstructive pneumonitis/consolidation in the right lower lobe. Please see additional findings discussion below. 2.  Small right pleural effusion, possibly malignant. 3.  Necrotic appearing lymph node intimately associated with the pericardium.  CT ABDOMEN AND PELVIS  Findings:  Heterogeneous low attenuation lesions in the right hepatic lobe measure up to 1.7 x 1.8 cm (image 60).  A necrotic appearing nodal mass in the porta hepatis measures 3.0 x 3.8 cm. Narrowing and probable invasion of the main portal vein in association (example image 62).  Gallbladder is unremarkable.  A lesion in the right adrenal gland measures 1.2 x 1.8 cm.  Left adrenal lesion measures 0.9 x 1.5 cm.  Kidneys, spleen, pancreas, stomach and bowel are unremarkable.  A 2 mm nodule is seen in the posterior left lower quadrant, adjacent to the descending colon (image 79).  Similarly, a subcutaneous nodule just superior to the left iliac crest measures 1.0 x 1.3 cm (image 84).  Atherosclerotic calcification of the arterial vasculature without abdominal aortic aneurysm.  No  pathologically enlarged retroperitoneal or mesenteric lymph nodes.  No free fluid.  Lytic expansion is seen in the right sacrum with associated fracture (coronal image 51).  Sclerotic lesions are seen in the right iliac wing and T3 vertebral body and are not necessarily metastatic as bone islands would appear similar.  IMPRESSION:  1.  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  Additional soft tissue nodules in the left lower quadrant and in the subcutaneous fat overlying the left iliac crest.  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 2.  Necrotic appearing porta hepatis adenopathy with probable invasion of the adjacent portal vein. 3.  Pathologic right sacral fracture.   Original Report Authenticated By: Leanna Battles, M.D.   US Abdomen Limited  03/01/2013   *RADIOLOGY REPORT*  Clinical Data: Multiple lesions worrisome for metastatic disease.  ABDOMEN ULTRASOUND LIMITED  Technique: Scanning in preparation for liver biopsy was performed.  Comparison:  None.  Findings: The lesion within the liver in question is towards the dome and difficult to approach by ultrasound.  IMPRESSION: The liver lesion is identified but difficult to approach.  The patient will be biopsied in CT.   Original Report Authenticated By: Jolaine Click, M.D.   Ct Biopsy  03/01/2013   *RADIOLOGY REPORT*  Clinical Data/Indication: METASTATIC DISEASE  CT-GUIDED BIOPSY right sacral biopsy  Sedation: Versed 2.0 mg, Fentanyl 150 mcg.  Total Moderate Sedation Time: 20 minutes.  Procedure: The procedure, risks, benefits, and alternatives were explained to the patient. Questions regarding the procedure were encouraged and answered. The patient understands and consents to the procedure.  The low back was prepped with betadine in a sterile fashion, and a sterile drape was applied covering the operative field. A  sterile gown and sterile gloves were used for the procedure.  Under CT guidance, a 17 gauge needle was inserted into the right sacral lesion.  Five 18 gauge core biopsies were obtained. Final imaging was performed.  Patient tolerated the procedure well without complication.  Vital sign monitoring by nursing staff during the procedure will continue as patient is in the special procedures unit for post procedure observation.  Findings: The images document guide needle placement within the right sacral lesion.  Post biopsy images demonstrate no evidence of hemorrhage.  IMPRESSION: Successful CT-guided right sacral lesion biopsy.   Original Report Authenticated By: Jolaine Click, M.D.    ASSESSMENT: This is a very pleasant 62 years old white female recently diagnosed with extensive stage small cell lung cancer with bulky mediastinal adenopathy in addition to liver, adrenal, porta hepatis as well as large sacral Mass.   PLAN: I had a lengthy discussion with the patient today about her disease stage, prognosis and treatment options and showed her the images of the scans. I explained to the patient that she has incurable condition. I discussed with the patient the treatment options including palliative care and hospice referral versus proceeding with systemic chemotherapy with carboplatin and etoposide. The patient is interested in treatment. I would consider her for chemotherapy regimen consisting of carboplatin for AUC of 5 on day 1 and etoposide 120 mg/M2 on days 1, 2 and 3 with Neulasta support on day 4. I discussed with the patient adverse effect of the chemotherapy including but not limited to alopecia, myelosuppression, nausea and vomiting, peripheral neuropathy, liver or renal dysfunction. The patient would have a chemotherapy education class before starting the first cycle of her chemotherapy. I will call her pharmacy with prescription for Compazine 10 mg by mouth every 6 hours as needed for nausea in  addition to EMLA cream to be applied to the Port-A-Cath site before starting the chemotherapy. I will refer the patient to interventional radiology for consideration of Port-A-Cath placement. I will complete the staging workup by ordering an MRI of the brain to rule out brain metastasis.   The patient is expected to start the first cycle of her chemotherapy next week. She would come back for follow up visit in 2 weeks for evaluation and management any adverse effect of her chemotherapy. For smoke cessation, I strongly encouraged the patient to quit smoking and offered her smoke cessation program and  handouts. I gave the patient the time to ask questions and I answered them completely to their satisfaction. She was advised to call immediately if she has any concerning symptoms in the interval. The patient will continue on the palliative radiotherapy to the sacral area under the care of Dr. Basilio Cairo as scheduled.  All questions were answered. The patient knows to call the clinic with any problems, questions or concerns. We can certainly see the patient much sooner if necessary.  Thank you so much for allowing me to participate in the care of Kristina Fischer. I will continue to follow up the patient with you and assist in her care.  I spent 45 minutes counseling the patient face to face. The total time spent in the appointment was 70 minutes.  Savanna Dooley K. 03/08/2013, 2:35 PM

## 2013-03-08 NOTE — Telephone Encounter (Signed)
lvm for pt regarding to IR port place appt tomorrow after radiation

## 2013-03-08 NOTE — Progress Notes (Signed)
   Thoracic Treatment Summary Name: Kristina Fischer Date: 03/08/13 DOB: 2051/01/09 Your Medical Team Medical Oncologist: Dr. Arbutus Ped  Radiation Oncologist: Dr. Basilio Cairo Pulmonologist: Dr. Shelle Iron  Type and Stage of Lung Cancer  Small Cell Carcinoma: Extensive Stage   Clinical stage is based on radiology exams.  Pathological stage will be determined after surgery.  Staging is based on the size of the tumor, involvement of lymph nodes or not, and whether or not the cancer center has spread. Recommendations Recommendations:Chemo Class, Radiation therapy, Port a cath  These recommendations are based on information available as of today's consult.  This is subject to change depending further testing or exams. Next Steps Next Step: 1. Schedule chemo 2. Set up for radiation next at 03/08/13 at 5:15 3. Central scheduling will schedule port placement  Barriers to Care What do you perceive as a potential barrier that may prevent you from receiving your treatment plan?No perception of any barriers at this time.  Booklet from [employer] given and resource for [employer] given Questions Willette Pa, RN BSN Thoracic Oncology Nurse Navigator at [phone]  [name] is a nurse navigator that is available to assist you through your cancer journey.  She can answer your questions and/or provide resources regarding your treatment plan, emotional support, or financial concerns.

## 2013-03-08 NOTE — Telephone Encounter (Signed)
Gave pt appt for lab, MD chemo and chemi class for June and July 2014

## 2013-03-08 NOTE — Telephone Encounter (Signed)
Per staff message and POF I have scheduled appts.  JMW  

## 2013-03-09 ENCOUNTER — Encounter: Payer: Self-pay | Admitting: *Deleted

## 2013-03-09 ENCOUNTER — Ambulatory Visit (HOSPITAL_COMMUNITY)
Admission: RE | Admit: 2013-03-09 | Discharge: 2013-03-09 | Disposition: A | Discharge status: Home / Self Care | Payer: BC Managed Care – PPO | Source: Ambulatory Visit | Attending: Internal Medicine | Admitting: Internal Medicine

## 2013-03-09 ENCOUNTER — Other Ambulatory Visit: Payer: Self-pay | Admitting: Internal Medicine

## 2013-03-09 ENCOUNTER — Encounter (HOSPITAL_COMMUNITY): Payer: Self-pay

## 2013-03-09 ENCOUNTER — Encounter: Payer: Self-pay | Admitting: Internal Medicine

## 2013-03-09 ENCOUNTER — Other Ambulatory Visit: Payer: Self-pay | Admitting: Radiation Oncology

## 2013-03-09 ENCOUNTER — Ambulatory Visit
Admission: RE | Admit: 2013-03-09 | Discharge: 2013-03-09 | Disposition: A | Discharge status: Home / Self Care | Payer: BC Managed Care – PPO | Source: Ambulatory Visit | Attending: Radiation Oncology | Admitting: Radiation Oncology

## 2013-03-09 DIAGNOSIS — C7A090 Malignant carcinoid tumor of the bronchus and lung: Secondary | ICD-10-CM | POA: Insufficient documentation

## 2013-03-09 DIAGNOSIS — C3491 Malignant neoplasm of unspecified part of right bronchus or lung: Secondary | ICD-10-CM

## 2013-03-09 DIAGNOSIS — M5430 Sciatica, unspecified side: Secondary | ICD-10-CM | POA: Insufficient documentation

## 2013-03-09 DIAGNOSIS — M129 Arthropathy, unspecified: Secondary | ICD-10-CM | POA: Diagnosis not present

## 2013-03-09 DIAGNOSIS — Z79899 Other long term (current) drug therapy: Secondary | ICD-10-CM | POA: Diagnosis not present

## 2013-03-09 DIAGNOSIS — J4489 Other specified chronic obstructive pulmonary disease: Secondary | ICD-10-CM | POA: Insufficient documentation

## 2013-03-09 DIAGNOSIS — J449 Chronic obstructive pulmonary disease, unspecified: Secondary | ICD-10-CM | POA: Diagnosis not present

## 2013-03-09 HISTORY — DX: Anxiety disorder, unspecified: F41.9

## 2013-03-09 HISTORY — DX: Malignant (primary) neoplasm, unspecified: C80.1

## 2013-03-09 HISTORY — DX: Rheumatoid arthritis, unspecified: M06.9

## 2013-03-09 HISTORY — DX: Shortness of breath: R06.02

## 2013-03-09 HISTORY — DX: Malignant neoplasm of unspecified part of unspecified bronchus or lung: C34.90

## 2013-03-09 HISTORY — DX: Dysphagia, unspecified: R13.10

## 2013-03-09 LAB — CBC
MCH: 30.1 pg (ref 26.0–34.0)
MCHC: 33.2 g/dL (ref 30.0–36.0)
Platelets: 553 10*3/uL — ABNORMAL HIGH (ref 150–400)
RDW: 14.1 % (ref 11.5–15.5)

## 2013-03-09 MED ORDER — CEFAZOLIN SODIUM-DEXTROSE 2-3 GM-% IV SOLR
2.0000 g | INTRAVENOUS | Status: AC
  Administered 2013-03-09: 2 g via INTRAVENOUS
  Filled 2013-03-09: qty 50

## 2013-03-09 MED ORDER — MIDAZOLAM HCL 2 MG/2ML IJ SOLN
INTRAMUSCULAR | Status: AC
  Filled 2013-03-09: qty 6

## 2013-03-09 MED ORDER — LIDOCAINE HCL 1 % IJ SOLN
INTRAMUSCULAR | Status: AC
  Filled 2013-03-09: qty 20

## 2013-03-09 MED ORDER — SODIUM CHLORIDE 0.9 % IV SOLN: INTRAVENOUS | Status: DC

## 2013-03-09 MED ORDER — MIDAZOLAM HCL 2 MG/2ML IJ SOLN
INTRAMUSCULAR | Status: AC | PRN
  Administered 2013-03-09 (×2): 0.5 mg via INTRAVENOUS
  Administered 2013-03-09: 1 mg via INTRAVENOUS

## 2013-03-09 MED ORDER — HEPARIN SOD (PORK) LOCK FLUSH 100 UNIT/ML IV SOLN
INTRAVENOUS | Status: AC | PRN
  Administered 2013-03-09: 500 [IU]

## 2013-03-09 MED ORDER — FENTANYL CITRATE 0.05 MG/ML IJ SOLN
INTRAMUSCULAR | Status: AC
  Filled 2013-03-09: qty 6

## 2013-03-09 MED ORDER — FENTANYL CITRATE 0.05 MG/ML IJ SOLN
INTRAMUSCULAR | Status: AC | PRN
  Administered 2013-03-09 (×2): 50 ug via INTRAVENOUS

## 2013-03-09 NOTE — H&P (Signed)
Kristina Fischer is an 62 y.o. female.   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  Past Medical History  Diagnosis Date  . Arthritis   . Sciatica   . COPD (chronic obstructive pulmonary disease)   . Sacral mass     Past Surgical History  Procedure Laterality Date  . Abdominal hysterectomy    . Rhinoplasty      Family History  Problem Relation Age of Onset  . Lung cancer Brother   . Ovarian cancer Sister   . Cancer Brother     face  . Cancer Father     Unknown type   Social History:  reports that she has been smoking Cigarettes.  She has been smoking about 1.00 pack per day. She does not have any smokeless tobacco history on file. She reports that  drinks alcohol. She reports that she does not use illicit drugs.  Allergies: No Known Allergies  Current outpatient prescriptions:albuterol (PROVENTIL HFA;VENTOLIN HFA) 108 (90 BASE) MCG/ACT inhaler, Inhale 1-2 puffs into the lungs every 6 (six) hours as needed for wheezing., Disp: 1 Inhaler, Rfl: 0;  dexamethasone (DECADRON) 4 MG tablet, Take 1 tablet (4 mg total) by mouth 2 (two) times daily with a meal., Disp: 40 tablet, Rfl: 0 HYDROcodone-acetaminophen (NORCO/VICODIN) 5-325 MG per tablet, Take 1-2 tablets by mouth every 6 (six) hours as needed for pain., Disp: 40 tablet, Rfl: 0;  oxyCODONE (OXY IR/ROXICODONE) 5 MG immediate release tablet, Take 1 tablet (5 mg total) by mouth every 3 (three) hours as needed for pain., Disp: 60 tablet, Rfl: 0;  lidocaine-prilocaine (EMLA) cream, Apply topically as needed., Disp: 30 g, Rfl: 0 OVER THE COUNTER MEDICATION, Take 2 capsules by mouth daily. Natural Supplement from health food store for arthritis., Disp: , Rfl: ;  prochlorperazine (COMPAZINE) 10 MG tablet, Take 1 tablet (10 mg total) by mouth every 6 (six) hours as needed., Disp: 60 tablet,  Rfl: 0 Current facility-administered medications:0.9 %  sodium chloride infusion, , Intravenous, Continuous, D Kevin Allred, PA-C;  ceFAZolin (ANCEF) IVPB 2 g/50 mL premix, 2 g, Intravenous, On Call, D Kevin Allred, PA-C  No results found for this or any previous visit (from the past 48 hour(s)). No results found.  Review of Systems  Constitutional: Negative for fever and chills.  Respiratory: Positive for cough and shortness of breath.   Cardiovascular: Negative for chest pain.  Gastrointestinal: Negative for nausea, vomiting and abdominal pain.  Musculoskeletal: Positive for back pain.  Neurological: Negative for headaches.  Vitals:  BP 153/96  HR 113  R 20  O2 SATS 91% RA  TEMP 97.9  Physical Exam  Constitutional: She is oriented to person, place, and time.  Thin WF in NAD  Cardiovascular:  Tachy but regular rhythm  Respiratory: Effort normal.  Dim BS rt mid to lower lung fields, left clear  GI: Soft. Bowel sounds are normal. She exhibits no distension. There is no tenderness.  Musculoskeletal: Normal range of motion. She exhibits no edema.  Neurological: She is alert and oriented to person, place, and time.     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 Plan is for port a cath placement today for chemotherapy. Details/risks of procedure d/w pt with her understanding and consent.  ALLRED,D KEVIN 03/09/2013, 12:07 PM

## 2013-03-09 NOTE — Progress Notes (Signed)
Gas card application completed

## 2013-03-09 NOTE — Procedures (Signed)
Successful RT IJ POWER PAC TIP SVC/RA NO COMP FULL REPORT IN PACS

## 2013-03-09 NOTE — Patient Instructions (Signed)
You are recently diagnosed with extensive stage small cell lung cancer. We discussed treatment options including systemic chemotherapy with carboplatin and etoposide. First cycle next week. Followup visit in 2 weeks.

## 2013-03-09 NOTE — Discharge Instructions (Signed)
Moderate Sedation, Adult °Moderate sedation is given to help you relax or even sleep through a procedure. You may remain sleepy, be clumsy, or have poor balance for several hours following this procedure. Arrange for a responsible adult, family member, or friend to take you home. A responsible adult should stay with you for at least 24 hours or until the medicines have worn off. °· Do not participate in any activities where you could become injured for the next 24 hours, or until you feel normal again. Do not: °· Drive. °· Swim. °· Ride a bicycle. °· Operate heavy machinery. °· Cook. °· Use power tools. °· Climb ladders. °· Work at heights. °· Do not make important decisions or sign legal documents until you are improved. °· Vomiting may occur if you eat too soon. When you can drink without vomiting, try water, juice, or soup. Try solid foods if you feel little or no nausea. °· Only take over-the-counter or prescription medications for pain, discomfort, or fever as directed by your caregiver.If pain medications have been prescribed for you, ask your caregiver how soon it is safe to take them. °· Make sure you and your family fully understands everything about the medication given to you. Make sure you understand what side effects may occur. °· You should not drink alcohol, take sleeping pills, or medications that cause drowsiness for at least 24 hours. °· If you smoke, do not smoke alone. °· If you are feeling better, you may resume normal activities 24 hours after receiving sedation. °· Keep all appointments as scheduled. Follow all instructions. °· Ask questions if you do not understand. °SEEK MEDICAL CARE IF:  °· Your skin is pale or bluish in color. °· You continue to feel sick to your stomach (nauseous) or throw up (vomit). °· Your pain is getting worse and not helped by medication. °· You have bleeding or swelling. °· You are still sleepy or feeling clumsy after 24 hours. °SEEK IMMEDIATE MEDICAL CARE IF:   °· You develop a rash. °· You have difficulty breathing. °· You develop any type of allergic problem. °· You have a fever. °Document Released: 06/08/2001 Document Revised: 12/06/2011 Document Reviewed: 10/30/2007 °ExitCare® Patient Information ©2014 [employer]. °Implanted Port Instructions °An implanted port is a central line that has a round shape and is placed under the skin. It is used for long-term IV (intravenous) access for: °· Medicine. °· Fluids. °· Liquid nutrition, such as TPN (total parenteral nutrition). °· Blood samples. °Ports can be placed: °· In the chest area just below the collarbone (this is the most common place.) °· In the arms. °· In the belly (abdomen) area. °· In the legs. °PARTS OF THE PORT °A port has 2 main parts: °· The reservoir. The reservoir is round, disc-shaped, and will be a small, raised area under your skin. °· The reservoir is the part where a needle is inserted (accessed) to either give medicines or to draw blood. °· The catheter. The catheter is a long, slender tube that extends from the reservoir. The catheter is placed into a large vein. °· Medicine that is inserted into the reservoir goes into the catheter and then into the vein. °INSERTION OF THE PORT °· The port is surgically placed in either an operating room or in a procedural area (interventional radiology). °· Medicine may be given to help you relax during the procedure. °· The skin where the port will be inserted is numbed (local anesthetic). °· 1 or 2 small cuts (  incisions) will be made in the skin to insert the port. °· The port can be used after it has been inserted. °INCISION SITE CARE °· The incision site may have small adhesive strips on it. This helps keep the incision site closed. Sometimes, no adhesive strips are placed. Instead of adhesive strips, a special kind of surgical glue is used to keep the incision closed. °· If adhesive strips were placed on the incision sites, do not take them off. They will  fall off on their own. °· The incision site may be sore for 1 to 2 days. Pain medicine can help. °· Do not get the incision site wet. Bathe or shower as directed by your caregiver. °· The incision site should heal in 5 to 7 days. A small scar may form after the incision has healed. °ACCESSING THE PORT °Special steps must be taken to access the port: °· Before the port is accessed, a numbing cream can be placed on the skin. This helps numb the skin over the port site. °· A sterile technique is used to access the port. °· The port is accessed with a needle. Only "non-coring" port needles should be used to access the port. Once the port is accessed, a blood return should be checked. This helps ensure the port is in the vein and is not clogged (clotted). °· If your caregiver believes your port should remain accessed, a clear (transparent) bandage will be placed over the needle site. The bandage and needle will need to be changed every week or as directed by your caregiver. °· Keep the bandage covering the needle clean and dry. Do not get it wet. Follow your caregiver's instructions on how to take a shower or bath when the port is accessed. °· If your port does not need to stay accessed, no bandage is needed over the port. °FLUSHING THE PORT °Flushing the port keeps it from getting clogged. How often the port is flushed depends on: °· If a constant infusion is running. If a constant infusion is running, the port may not need to be flushed. °· If intermittent medicines are given. °· If the port is not being used. °For intermittent medicines: °· The port will need to be flushed: °· After medicines have been given. °· After blood has been drawn. °· As part of routine maintenance. °· A port is normally flushed with: °· Normal saline. °· Heparin. °· Follow your caregiver's advice on how often, how much, and the type of flush to use on your port. °IMPORTANT PORT INFORMATION °· Tell your caregiver if you are allergic to  heparin. °· After your port is placed, you will get a manufacturer's information card. The card has information about your port. Keep this card with you at all times. °· There are many types of ports available. Know what kind of port you have. °· In case of an emergency, it may be helpful to wear a medical alert bracelet. This can help alert health care workers that you have a port. °· The port can stay in for as long as your caregiver believes it is necessary. °· When it is time for the port to come out, surgery will be done to remove it. The surgery will be similar to how the port was put in. °· If you are in the hospital or clinic: °· Your port will be taken care of and flushed by a nurse. °· If you are at home: °· A home health care nurse may   give medicines and take care of the port. °· You or a family member can get special training and directions for giving medicine and taking care of the port at home. °SEEK IMMEDIATE MEDICAL CARE IF:  °· Your port does not flush or you are unable to get a blood return. °· New drainage or pus is coming from the incision. °· A bad smell is coming from the incision site. °· You develop swelling or increased redness at the incision site. °· You develop increased swelling or pain at the port site. °· You develop swelling or pain in the surrounding skin near the port. °· You have an oral temperature above 102° F (38.9° C), not controlled by medicine. °MAKE SURE YOU:  °· Understand these instructions. °· Will watch your condition. °· Will get help right away if you are not doing well or get worse. °Document Released: 09/13/2005 Document Revised: 12/06/2011 Document Reviewed: 12/05/2008 °ExitCare® Patient Information ©2014 [employer]. ° °

## 2013-03-12 ENCOUNTER — Encounter: Payer: Self-pay | Admitting: Radiation Oncology

## 2013-03-12 ENCOUNTER — Ambulatory Visit
Admission: RE | Admit: 2013-03-12 | Discharge: 2013-03-12 | Disposition: A | Discharge status: Home / Self Care | Payer: BC Managed Care – PPO | Source: Ambulatory Visit | Attending: Radiation Oncology | Admitting: Radiation Oncology

## 2013-03-12 ENCOUNTER — Ambulatory Visit (HOSPITAL_COMMUNITY)
Admission: RE | Admit: 2013-03-12 | Discharge: 2013-03-12 | Disposition: A | Discharge status: Home / Self Care | Payer: BC Managed Care – PPO | Source: Ambulatory Visit | Attending: Radiation Oncology | Admitting: Radiation Oncology

## 2013-03-12 VITALS — BP 164/87 | HR 95 | Temp 98.1°F | Ht 63.0 in | Wt 101.0 lb

## 2013-03-12 DIAGNOSIS — G319 Degenerative disease of nervous system, unspecified: Secondary | ICD-10-CM | POA: Insufficient documentation

## 2013-03-12 DIAGNOSIS — C7949 Secondary malignant neoplasm of other parts of nervous system: Secondary | ICD-10-CM | POA: Insufficient documentation

## 2013-03-12 DIAGNOSIS — C7931 Secondary malignant neoplasm of brain: Secondary | ICD-10-CM | POA: Insufficient documentation

## 2013-03-12 DIAGNOSIS — C7952 Secondary malignant neoplasm of bone marrow: Secondary | ICD-10-CM

## 2013-03-12 DIAGNOSIS — C3490 Malignant neoplasm of unspecified part of unspecified bronchus or lung: Secondary | ICD-10-CM | POA: Insufficient documentation

## 2013-03-12 DIAGNOSIS — C342 Malignant neoplasm of middle lobe, bronchus or lung: Secondary | ICD-10-CM

## 2013-03-12 DIAGNOSIS — R5381 Other malaise: Secondary | ICD-10-CM | POA: Insufficient documentation

## 2013-03-12 DIAGNOSIS — C7951 Secondary malignant neoplasm of bone: Secondary | ICD-10-CM

## 2013-03-12 MED ORDER — FLUCONAZOLE 100 MG PO TABS: ORAL_TABLET | ORAL | Status: DC

## 2013-03-12 MED ORDER — GADOBENATE DIMEGLUMINE 529 MG/ML IV SOLN
9.0000 mL | Freq: Once | INTRAVENOUS | Status: AC | PRN
  Administered 2013-03-12: 9 mL via INTRAVENOUS

## 2013-03-12 MED ORDER — OXYCODONE HCL 5 MG PO TABS: 5.0000 mg | ORAL_TABLET | ORAL | Status: AC | PRN

## 2013-03-12 NOTE — Progress Notes (Signed)
.    Ms Kristina Fischer has received 3 fractions to her right Hilum and L5-S4 spine.  She  c/o pain at a level 7-8 pain level in the right ribs and right hip regions.  She appears very fatigued.  She has multiple bruises on her  bilateral arms.   She notes it is harder to fall asleep presently. She is currently on Decadron 4 mg po BID.  Note a few white ares near the back of her throatt in the hard palate region and she reports that it took "forever to get the white stuff off of her tongue."  Requesting refill on her Oxycodone because she is afraid of running out of her medictions  Last refill was for 60 tabs on 03/06/13.  Taking 1 po Q 3 hrs prn pain.  She has edema of her lower extremities.

## 2013-03-12 NOTE — Progress Notes (Signed)
   Weekly Management Note:  outpatient Current Dose:  3/30Gy to Right hilum, 9/30Gy to L5-S4   Narrative:  The patient presents for routine under treatment assessment.  CBCT/MVCT images/Port film x-rays were reviewed.  The chart was checked. Pain improved. Noticing LE edema since starting steroid. On Decadron 4mg  BID.  MRI brain this PM. Numbness in RLE almost resolved.  Physical Findings:  height is 5\' 3"  (1.6 m) and weight is 101 lb (45.813 kg). Her temperature is 98.1 F (36.7 C). Her blood pressure is 164/87 and her pulse is 95.  thrush noted in oropharynx.  Mild LE edema.  CBC    Component Value Date/Time   WBC 10.0 03/09/2013 1220   RBC 4.15 03/09/2013 1220   HGB 12.5 03/09/2013 1220   HCT 37.7 03/09/2013 1220   PLT 553* 03/09/2013 1220   MCV 90.8 03/09/2013 1220   MCH 30.1 03/09/2013 1220   MCHC 33.2 03/09/2013 1220   RDW 14.1 03/09/2013 1220   LYMPHSABS 1.2 02/26/2013 0927   MONOABS 0.6 02/26/2013 0927   EOSABS 0.1 02/26/2013 0927   BASOSABS 0.1 02/26/2013 0927     CMP     Component Value Date/Time   NA 139 02/26/2013 0927   K 3.9 02/26/2013 0927   CL 98 02/26/2013 0927   CO2 30 02/26/2013 0927   GLUCOSE 110* 02/26/2013 0927   BUN 9 02/26/2013 0927   CREATININE 0.73 02/26/2013 0927   CALCIUM 9.2 02/26/2013 0927   PROT 6.9 02/26/2013 0927   ALBUMIN 2.8* 02/26/2013 0927   AST 27 02/26/2013 0927   ALT 18 02/26/2013 0927   ALKPHOS 105 02/26/2013 0927   BILITOT 0.2* 02/26/2013 0927   GFRNONAA >90 02/26/2013 0927   GFRAA >90 02/26/2013 1308     Impression:  The patient is tolerating radiotherapy.  Plan:  Continue radiotherapy as planned. Rx refill for oxycodone disp #90.  Diflucan for thrush.  Taper provided for Decadron.  Take 1 tablet (4mg ) twice daily through 6/20. Then, take 1 tablet (4mg ) at breakfast only through 7/4. Then, take 1 tablet every other day through 7/12.  Then, stop taking Dexamethasone. -----------------------------------  Lonie Peak, MD

## 2013-03-12 NOTE — Patient Instructions (Addendum)
Dexamethasone Taper  Take 1 tablet (4mg ) twice daily through 6/20. Then, take 1 tablet (4mg ) at breakfast only through 7/4. Then, take 1 tablet every other day through 7/12.  Then, stop taking Dexamethasone.

## 2013-03-13 ENCOUNTER — Encounter: Payer: Self-pay | Admitting: *Deleted

## 2013-03-13 ENCOUNTER — Other Ambulatory Visit (HOSPITAL_BASED_OUTPATIENT_CLINIC_OR_DEPARTMENT_OTHER): Payer: BC Managed Care – PPO | Admitting: Lab

## 2013-03-13 ENCOUNTER — Ambulatory Visit
Admission: RE | Admit: 2013-03-13 | Discharge: 2013-03-13 | Disposition: A | Discharge status: Home / Self Care | Payer: BC Managed Care – PPO | Source: Ambulatory Visit | Attending: Radiation Oncology | Admitting: Radiation Oncology

## 2013-03-13 ENCOUNTER — Other Ambulatory Visit: Payer: BC Managed Care – PPO

## 2013-03-13 ENCOUNTER — Ambulatory Visit (HOSPITAL_BASED_OUTPATIENT_CLINIC_OR_DEPARTMENT_OTHER): Payer: BC Managed Care – PPO

## 2013-03-13 VITALS — BP 133/81 | HR 104 | Temp 98.1°F | Resp 18

## 2013-03-13 DIAGNOSIS — C3491 Malignant neoplasm of unspecified part of right bronchus or lung: Secondary | ICD-10-CM

## 2013-03-13 DIAGNOSIS — Z5111 Encounter for antineoplastic chemotherapy: Secondary | ICD-10-CM

## 2013-03-13 DIAGNOSIS — C342 Malignant neoplasm of middle lobe, bronchus or lung: Secondary | ICD-10-CM

## 2013-03-13 DIAGNOSIS — C7A090 Malignant carcinoid tumor of the bronchus and lung: Secondary | ICD-10-CM

## 2013-03-13 DIAGNOSIS — C787 Secondary malignant neoplasm of liver and intrahepatic bile duct: Secondary | ICD-10-CM

## 2013-03-13 DIAGNOSIS — C7B8 Other secondary neuroendocrine tumors: Secondary | ICD-10-CM

## 2013-03-13 LAB — CBC WITH DIFFERENTIAL/PLATELET
Basophils Absolute: 0.1 10*3/uL (ref 0.0–0.1)
Eosinophils Absolute: 0 10*3/uL (ref 0.0–0.5)
HCT: 39.6 % (ref 34.8–46.6)
HGB: 13.6 g/dL (ref 11.6–15.9)
MCH: 30.4 pg (ref 25.1–34.0)
MONO#: 0.8 10*3/uL (ref 0.1–0.9)
NEUT#: 13.6 10*3/uL — ABNORMAL HIGH (ref 1.5–6.5)
NEUT%: 89.7 % — ABNORMAL HIGH (ref 38.4–76.8)
WBC: 15.2 10*3/uL — ABNORMAL HIGH (ref 3.9–10.3)
lymph#: 0.7 10*3/uL — ABNORMAL LOW (ref 0.9–3.3)

## 2013-03-13 LAB — COMPREHENSIVE METABOLIC PANEL (CC13)
Albumin: 2.9 g/dL — ABNORMAL LOW (ref 3.5–5.0)
BUN: 14.8 mg/dL (ref 7.0–26.0)
CO2: 27 mEq/L (ref 22–29)
Calcium: 9.5 mg/dL (ref 8.4–10.4)
Chloride: 97 mEq/L — ABNORMAL LOW (ref 98–107)
Creatinine: 0.6 mg/dL (ref 0.6–1.1)
Glucose: 94 mg/dl (ref 70–99)

## 2013-03-13 MED ORDER — SODIUM CHLORIDE 0.9 % IV SOLN
417.0000 mg | Freq: Once | INTRAVENOUS | Status: AC
  Administered 2013-03-13: 420 mg via INTRAVENOUS
  Filled 2013-03-13: qty 42

## 2013-03-13 MED ORDER — DEXAMETHASONE SODIUM PHOSPHATE 20 MG/5ML IJ SOLN
20.0000 mg | Freq: Once | INTRAMUSCULAR | Status: AC
  Administered 2013-03-13: 20 mg via INTRAVENOUS

## 2013-03-13 MED ORDER — HEPARIN SOD (PORK) LOCK FLUSH 100 UNIT/ML IV SOLN
500.0000 [IU] | Freq: Once | INTRAVENOUS | Status: AC | PRN
  Administered 2013-03-13: 500 [IU]
  Filled 2013-03-13: qty 5

## 2013-03-13 MED ORDER — SODIUM CHLORIDE 0.9 % IJ SOLN
10.0000 mL | INTRAMUSCULAR | Status: DC | PRN
  Administered 2013-03-13: 10 mL
  Filled 2013-03-13: qty 10

## 2013-03-13 MED ORDER — ONDANSETRON 16 MG/50ML IVPB (CHCC)
16.0000 mg | Freq: Once | INTRAVENOUS | Status: AC
  Administered 2013-03-13: 16 mg via INTRAVENOUS

## 2013-03-13 MED ORDER — SODIUM CHLORIDE 0.9 % IV SOLN
Freq: Once | INTRAVENOUS | Status: AC
  Administered 2013-03-13: 15:00:00 via INTRAVENOUS

## 2013-03-13 MED ORDER — SODIUM CHLORIDE 0.9 % IV SOLN
120.0000 mg/m2 | Freq: Once | INTRAVENOUS | Status: AC
  Administered 2013-03-13: 170 mg via INTRAVENOUS
  Filled 2013-03-13: qty 8.5

## 2013-03-13 NOTE — Patient Instructions (Signed)
[facility] Discharge Instructions for Patients Receiving Chemotherapy  Today you received the following chemotherapy agents :  Carboplatin, Etoposide.  To help prevent nausea and vomiting after your treatment, we encourage you to take your nausea medication as instructed by your physician.  DO NOT  Eat  Greasy nor spicy foods;  DO Drink lots of fluids - more water as tolerated.  Take Compazine every 6 hrs as needed for nausea.  DO NOT Drive after taking Compazine - can cause drowsiness.   If you develop nausea and vomiting that is not controlled by your nausea medication, call the clinic.   BELOW ARE SYMPTOMS THAT SHOULD BE REPORTED IMMEDIATELY:  *FEVER GREATER THAN 100.5 F  *CHILLS WITH OR WITHOUT FEVER  NAUSEA AND VOMITING THAT IS NOT CONTROLLED WITH YOUR NAUSEA MEDICATION  *UNUSUAL SHORTNESS OF BREATH  *UNUSUAL BRUISING OR BLEEDING  TENDERNESS IN MOUTH AND THROAT WITH OR WITHOUT PRESENCE OF ULCERS  *URINARY PROBLEMS  *BOWEL PROBLEMS  UNUSUAL RASH Items with * indicate a potential emergency and should be followed up as soon as possible.  Feel free to call the clinic you have any questions or concerns. The clinic phone number is [phone].

## 2013-03-14 ENCOUNTER — Ambulatory Visit (HOSPITAL_BASED_OUTPATIENT_CLINIC_OR_DEPARTMENT_OTHER): Payer: BC Managed Care – PPO

## 2013-03-14 ENCOUNTER — Ambulatory Visit
Admission: RE | Admit: 2013-03-14 | Discharge: 2013-03-14 | Disposition: A | Discharge status: Home / Self Care | Payer: BC Managed Care – PPO | Source: Ambulatory Visit | Attending: Radiation Oncology | Admitting: Radiation Oncology

## 2013-03-14 VITALS — BP 155/94 | HR 95 | Temp 97.7°F | Resp 20

## 2013-03-14 DIAGNOSIS — C7B8 Other secondary neuroendocrine tumors: Secondary | ICD-10-CM

## 2013-03-14 DIAGNOSIS — C7A090 Malignant carcinoid tumor of the bronchus and lung: Secondary | ICD-10-CM

## 2013-03-14 DIAGNOSIS — C3491 Malignant neoplasm of unspecified part of right bronchus or lung: Secondary | ICD-10-CM

## 2013-03-14 DIAGNOSIS — Z5111 Encounter for antineoplastic chemotherapy: Secondary | ICD-10-CM

## 2013-03-14 DIAGNOSIS — C787 Secondary malignant neoplasm of liver and intrahepatic bile duct: Secondary | ICD-10-CM

## 2013-03-14 MED ORDER — SODIUM CHLORIDE 0.9 % IV SOLN
Freq: Once | INTRAVENOUS | Status: AC
  Administered 2013-03-14: 14:00:00 via INTRAVENOUS

## 2013-03-14 MED ORDER — DEXAMETHASONE SODIUM PHOSPHATE 10 MG/ML IJ SOLN
10.0000 mg | Freq: Once | INTRAMUSCULAR | Status: AC
Start: 2013-03-14 — End: 2013-03-14
  Administered 2013-03-14: 10 mg via INTRAVENOUS

## 2013-03-14 MED ORDER — SODIUM CHLORIDE 0.9 % IV SOLN
120.0000 mg/m2 | Freq: Once | INTRAVENOUS | Status: AC
  Administered 2013-03-14: 170 mg via INTRAVENOUS
  Filled 2013-03-14: qty 8.5

## 2013-03-14 MED ORDER — HEPARIN SOD (PORK) LOCK FLUSH 100 UNIT/ML IV SOLN
500.0000 [IU] | Freq: Once | INTRAVENOUS | Status: AC | PRN
  Administered 2013-03-14: 500 [IU]
  Filled 2013-03-14: qty 5

## 2013-03-14 MED ORDER — ONDANSETRON 8 MG/50ML IVPB (CHCC)
8.0000 mg | Freq: Once | INTRAVENOUS | Status: AC
  Administered 2013-03-14: 8 mg via INTRAVENOUS

## 2013-03-14 MED ORDER — SODIUM CHLORIDE 0.9 % IJ SOLN
10.0000 mL | INTRAMUSCULAR | Status: DC | PRN
  Administered 2013-03-14: 10 mL
  Filled 2013-03-14: qty 10

## 2013-03-14 NOTE — Patient Instructions (Addendum)
 [facility] Center Discharge Instructions for Patients Receiving Chemotherapy  Today you received the following chemotherapy agent Etoposide  To help prevent nausea and vomiting after your treatment, we encourage you to take your nausea medication.   If you develop nausea and vomiting that is not controlled by your nausea medication, call the clinic.   BELOW ARE SYMPTOMS THAT SHOULD BE REPORTED IMMEDIATELY:  *FEVER GREATER THAN 100.5 F  *CHILLS WITH OR WITHOUT FEVER  NAUSEA AND VOMITING THAT IS NOT CONTROLLED WITH YOUR NAUSEA MEDICATION  *UNUSUAL SHORTNESS OF BREATH  *UNUSUAL BRUISING OR BLEEDING  TENDERNESS IN MOUTH AND THROAT WITH OR WITHOUT PRESENCE OF ULCERS  *URINARY PROBLEMS  *BOWEL PROBLEMS  UNUSUAL RASH Items with * indicate a potential emergency and should be followed up as soon as possible.  Feel free to call the clinic you have any questions or concerns. The clinic phone number is [phone].

## 2013-03-15 ENCOUNTER — Ambulatory Visit
Admission: RE | Admit: 2013-03-15 | Discharge: 2013-03-15 | Disposition: A | Discharge status: Home / Self Care | Payer: BC Managed Care – PPO | Source: Ambulatory Visit | Attending: Radiation Oncology | Admitting: Radiation Oncology

## 2013-03-15 ENCOUNTER — Ambulatory Visit: Payer: BC Managed Care – PPO | Admitting: Nutrition

## 2013-03-15 ENCOUNTER — Ambulatory Visit (HOSPITAL_BASED_OUTPATIENT_CLINIC_OR_DEPARTMENT_OTHER): Payer: BC Managed Care – PPO

## 2013-03-15 VITALS — BP 154/95 | HR 102 | Temp 98.7°F

## 2013-03-15 DIAGNOSIS — C7B8 Other secondary neuroendocrine tumors: Secondary | ICD-10-CM

## 2013-03-15 DIAGNOSIS — C3491 Malignant neoplasm of unspecified part of right bronchus or lung: Secondary | ICD-10-CM

## 2013-03-15 DIAGNOSIS — C7A090 Malignant carcinoid tumor of the bronchus and lung: Secondary | ICD-10-CM

## 2013-03-15 DIAGNOSIS — C787 Secondary malignant neoplasm of liver and intrahepatic bile duct: Secondary | ICD-10-CM

## 2013-03-15 DIAGNOSIS — Z5111 Encounter for antineoplastic chemotherapy: Secondary | ICD-10-CM

## 2013-03-15 MED ORDER — HEPARIN SOD (PORK) LOCK FLUSH 100 UNIT/ML IV SOLN
500.0000 [IU] | Freq: Once | INTRAVENOUS | Status: AC | PRN
  Administered 2013-03-15: 500 [IU]
  Filled 2013-03-15: qty 5

## 2013-03-15 MED ORDER — SODIUM CHLORIDE 0.9 % IV SOLN
120.0000 mg/m2 | Freq: Once | INTRAVENOUS | Status: AC
  Administered 2013-03-15: 170 mg via INTRAVENOUS
  Filled 2013-03-15: qty 8.5

## 2013-03-15 MED ORDER — SODIUM CHLORIDE 0.9 % IJ SOLN
10.0000 mL | INTRAMUSCULAR | Status: DC | PRN
  Administered 2013-03-15: 10 mL
  Filled 2013-03-15: qty 10

## 2013-03-15 MED ORDER — ONDANSETRON 8 MG/50ML IVPB (CHCC)
8.0000 mg | Freq: Once | INTRAVENOUS | Status: AC
  Administered 2013-03-15: 8 mg via INTRAVENOUS

## 2013-03-15 MED ORDER — SODIUM CHLORIDE 0.9 % IV SOLN
Freq: Once | INTRAVENOUS | Status: AC
  Administered 2013-03-15: 15:00:00 via INTRAVENOUS

## 2013-03-15 MED ORDER — DEXAMETHASONE SODIUM PHOSPHATE 10 MG/ML IJ SOLN
10.0000 mg | Freq: Once | INTRAMUSCULAR | Status: AC
  Administered 2013-03-15: 10 mg via INTRAVENOUS

## 2013-03-15 NOTE — Patient Instructions (Addendum)
[facility] Discharge Instructions for Patients Receiving Chemotherapy  Today you received the following chemotherapy agents VP-16  To help prevent nausea and vomiting after your treatment, we encourage you to take your nausea medication     If you develop nausea and vomiting that is not controlled by your nausea medication, call the clinic.   BELOW ARE SYMPTOMS THAT SHOULD BE REPORTED IMMEDIATELY:  *FEVER GREATER THAN 100.5 F  *CHILLS WITH OR WITHOUT FEVER  NAUSEA AND VOMITING THAT IS NOT CONTROLLED WITH YOUR NAUSEA MEDICATION  *UNUSUAL SHORTNESS OF BREATH  *UNUSUAL BRUISING OR BLEEDING  TENDERNESS IN MOUTH AND THROAT WITH OR WITHOUT PRESENCE OF ULCERS  *URINARY PROBLEMS  *BOWEL PROBLEMS  UNUSUAL RASH Items with * indicate a potential emergency and should be followed up as soon as possible.  Feel free to call the clinic you have any questions or concerns. The clinic phone number is [phone].    

## 2013-03-15 NOTE — Progress Notes (Signed)
Patient is a 62 year old female diagnosed with metastatic neuroendocrine lung cancer. She is a patient of Dr. Shirline Frees.  Past medical history includes COPD, arthritis, sacral mass.  Medications include Decadron and Compazine.  Labs include glucose 110 and albumin 2.8 June 2.  Height: 63 inches. Weight: 101 pounds June 16. Usual body weight 112 pounds April 2014. BMI: 17.84.  Patient reports about a 15 pound weight loss from usual body weight. She has difficulty swallowing but consumes liquids without a problem. She reports it takes longer than normal to eat and drink. She does not enjoy eating breakfast but will consume lunch and dinner. She does consume high-protein foods.  Nutrition diagnosis: Inadequate oral intake related to diagnosis of metastatic cancer as evidenced by 15 pound weight loss and BMI of 17.84 which is underweight.  Patient meets criteria for severe malnutrition in the context of acute illness secondary to 10% weight loss in 2 months and depletion of body fat and muscle mass.  Intervention: Patient was educated to consume small, frequent meals with higher calorie, higher protein foods. I've educated her to consume Ensure Plus twice a day. I've educated her on strategies for consuming softer, moist foods for easier swallowing. I provided fact sheets and my contact information. I've also given her coupons to purchase Ensure Plus. All questions were answered. Teach back method used.  Monitoring, evaluation, goals: Patient will tolerate increased calories and protein and Ensure Plus twice a day to minimize further weight loss.  Next visit: Wednesday, June 9, during chemotherapy.

## 2013-03-16 ENCOUNTER — Ambulatory Visit: Payer: BC Managed Care – PPO

## 2013-03-16 ENCOUNTER — Other Ambulatory Visit: Payer: Self-pay | Admitting: Certified Registered Nurse Anesthetist

## 2013-03-16 ENCOUNTER — Ambulatory Visit (HOSPITAL_BASED_OUTPATIENT_CLINIC_OR_DEPARTMENT_OTHER): Payer: BC Managed Care – PPO

## 2013-03-16 ENCOUNTER — Encounter: Payer: Self-pay | Admitting: Radiation Oncology

## 2013-03-16 ENCOUNTER — Telehealth: Payer: Self-pay | Admitting: *Deleted

## 2013-03-16 VITALS — BP 148/86 | HR 109 | Temp 98.4°F

## 2013-03-16 DIAGNOSIS — Z5189 Encounter for other specified aftercare: Secondary | ICD-10-CM

## 2013-03-16 DIAGNOSIS — C7A090 Malignant carcinoid tumor of the bronchus and lung: Secondary | ICD-10-CM

## 2013-03-16 DIAGNOSIS — C3491 Malignant neoplasm of unspecified part of right bronchus or lung: Secondary | ICD-10-CM

## 2013-03-16 DIAGNOSIS — C787 Secondary malignant neoplasm of liver and intrahepatic bile duct: Secondary | ICD-10-CM

## 2013-03-16 MED ORDER — PEGFILGRASTIM INJECTION 6 MG/0.6ML
6.0000 mg | Freq: Once | SUBCUTANEOUS | Status: AC
  Administered 2013-03-16: 6 mg via SUBCUTANEOUS
  Filled 2013-03-16: qty 0.6

## 2013-03-16 NOTE — Progress Notes (Signed)
I reached the patient by phone today to discuss MRI Brain results.  I told her that Dr Arbutus Ped and I agree that the lesion in her brain does not need urgent RT, and she will proceed with chemotherapy as planned, for as long as Dr. Arbutus Ped sees fit.  Eventually, we can discuss brain RT to shrink down the lesion (if it hasn't shrunk w/ chemo) and prevent new lesions from blossoming in the brian. She is satisfied with this plan. -----------------------------------  Lonie Peak, MD

## 2013-03-16 NOTE — Telephone Encounter (Signed)
Kristina Fischer here for Neulasta injection following 1st vp16 chemo treatment.  States that she is doing well except for not sleeping well, probably from Decadron.  She is drinking well, just having difficulty eating from the pressure on her esophagus.  Is trying small snacks and using Ensure twice a day.  No nausea, vomiting or diarrhea.  All questions answered.  Knows to call if she has any problems or concerns.

## 2013-03-16 NOTE — Telephone Encounter (Signed)
Spoke with pt regarding fmla papers.  She will bring another copy either today or Monday.  She also has concerns about short term disability.  I stated when she comes for a shot today to speak with financial advocates.

## 2013-03-16 NOTE — Patient Instructions (Addendum)

## 2013-03-19 ENCOUNTER — Ambulatory Visit: Payer: BC Managed Care – PPO

## 2013-03-19 ENCOUNTER — Ambulatory Visit: Admission: RE | Admit: 2013-03-19 | Payer: BC Managed Care – PPO | Source: Ambulatory Visit

## 2013-03-20 ENCOUNTER — Encounter: Payer: Self-pay | Admitting: Internal Medicine

## 2013-03-20 ENCOUNTER — Ambulatory Visit (HOSPITAL_BASED_OUTPATIENT_CLINIC_OR_DEPARTMENT_OTHER): Payer: BC Managed Care – PPO | Admitting: Internal Medicine

## 2013-03-20 ENCOUNTER — Ambulatory Visit: Admission: RE | Admit: 2013-03-20 | Payer: BC Managed Care – PPO | Source: Ambulatory Visit

## 2013-03-20 ENCOUNTER — Other Ambulatory Visit (HOSPITAL_BASED_OUTPATIENT_CLINIC_OR_DEPARTMENT_OTHER): Payer: BC Managed Care – PPO | Admitting: Lab

## 2013-03-20 VITALS — BP 146/90 | HR 102 | Temp 98.2°F | Resp 18 | Ht 63.0 in | Wt 95.8 lb

## 2013-03-20 DIAGNOSIS — C3490 Malignant neoplasm of unspecified part of unspecified bronchus or lung: Secondary | ICD-10-CM

## 2013-03-20 DIAGNOSIS — C3491 Malignant neoplasm of unspecified part of right bronchus or lung: Secondary | ICD-10-CM

## 2013-03-20 DIAGNOSIS — C342 Malignant neoplasm of middle lobe, bronchus or lung: Secondary | ICD-10-CM

## 2013-03-20 LAB — CBC WITH DIFFERENTIAL/PLATELET
BASO%: 0.1 % (ref 0.0–2.0)
Basophils Absolute: 0 10*3/uL (ref 0.0–0.1)
HCT: 34.7 % — ABNORMAL LOW (ref 34.8–46.6)
HGB: 11.9 g/dL (ref 11.6–15.9)
MONO#: 0.1 10*3/uL (ref 0.1–0.9)
NEUT%: 90.2 % — ABNORMAL HIGH (ref 38.4–76.8)
RDW: 14.8 % — ABNORMAL HIGH (ref 11.2–14.5)
WBC: 2 10*3/uL — ABNORMAL LOW (ref 3.9–10.3)
lymph#: 0.1 10*3/uL — ABNORMAL LOW (ref 0.9–3.3)

## 2013-03-20 LAB — COMPREHENSIVE METABOLIC PANEL (CC13)
ALT: 31 U/L (ref 0–55)
AST: 26 U/L (ref 5–34)
Albumin: 2.9 g/dL — ABNORMAL LOW (ref 3.5–5.0)
CO2: 26 mEq/L (ref 22–29)
Calcium: 9 mg/dL (ref 8.4–10.4)
Chloride: 96 mEq/L — ABNORMAL LOW (ref 98–107)
Creatinine: 0.6 mg/dL (ref 0.6–1.1)
Potassium: 3.8 mEq/L (ref 3.5–5.1)

## 2013-03-20 MED ORDER — MIRTAZAPINE 30 MG PO TABS: 30.0000 mg | ORAL_TABLET | Freq: Every day | ORAL | Status: AC

## 2013-03-20 NOTE — Patient Instructions (Addendum)
Neutropenic precaution. Followup in 2 weeks Remeron for depression and appetite.

## 2013-03-20 NOTE — Progress Notes (Signed)
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 Basilio Cairo  CURRENT THERAPY: Systemic chemotherapy with carboplatin for AUC of 5 on day 1 and etoposide 120 mg/M2 on days 1, 2 and 3 with Neulasta support on day 4, status post 1 cycle. First dose was given on 03/13/2013   INTERVAL HISTORY: Kristina Fischer 62 y.o. female returns to the clinic today for followup visit. The patient started the first cycle of her systemic chemotherapy with carboplatin and etoposide last week. She tolerated her treatment fairly well with no significant complaints except for insomnia. The patient continues to have mild pain in the lower back and she is currently on oxycodone when necessary. She denied having any significant fever or chills. She denied having any nausea or vomiting. The patient denied having any significant chest pain but continues to have shortness of breath with exertion, no cough or hemoptysis. Unfortunately she continues to smoke and I strongly encouraged her to quit smoking and offered her smoke cessation program, but she would like to work on quitting smoking by herself. She had been MRI performed on 03/12/2013 and it showed a small lesion measuring 5 x 7 mm in the right superior vermis.   MEDICAL HISTORY: Past Medical History  Diagnosis Date  . Arthritis   . Sciatica   . COPD (chronic obstructive pulmonary disease)   . Sacral mass   . Cancer   . Anxiety   . Shortness of breath   . 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 Rheumatoid arthritis   .  Difficulty swallowing     ALLERGIES:  has No Known Allergies.  MEDICATIONS:  Current Outpatient Prescriptions  Medication Sig Dispense Refill  . albuterol (PROVENTIL HFA;VENTOLIN HFA) 108 (90 BASE) MCG/ACT inhaler Inhale 1-2 puffs into the lungs every 6 (six) hours as needed for wheezing.  1 Inhaler  0  . dexamethasone (DECADRON) 4 MG tablet Take 1 tablet (4 mg total) by mouth 2 (two) times daily with a meal.  40 tablet  0  . fluconazole (DIFLUCAN) 100 MG tablet Take 2 tabs today, then 1 tab daily for 20 more days  22 tablet  0  . HYDROcodone-acetaminophen (NORCO/VICODIN) 5-325 MG per tablet Take 1-2 tablets by mouth every 6 (six) hours as needed for pain.  40 tablet  0  . lidocaine-prilocaine (EMLA) cream Apply topically as needed.  30 g  0  . oxyCODONE (OXY IR/ROXICODONE) 5 MG immediate release tablet Take 1 tablet (5 mg total) by mouth every 3 (three) hours as needed for pain.  90 tablet  0  . prochlorperazine (COMPAZINE) 10 MG tablet Take 1 tablet (10 mg total) by mouth every 6 (six) hours as needed.  60 tablet  0  . OVER THE COUNTER MEDICATION Take 2 capsules by mouth daily. Natural Supplement from health food store for arthritis.      . Sennosides (SENNA) 15 MG TABS Take by mouth.       No current facility-administered medications for this visit.    SURGICAL HISTORY:  Past  Surgical History  Procedure Laterality Date  . Abdominal hysterectomy    . Rhinoplasty      REVIEW OF SYSTEMS:  A comprehensive review of systems was negative except for: Constitutional: positive for fatigue Behavioral/Psych: positive for sleep disturbance   PHYSICAL EXAMINATION: General appearance: alert, cooperative and no distress Head: Normocephalic, without obvious abnormality, atraumatic Neck: no adenopathy Lymph nodes: Cervical, supraclavicular, and axillary nodes normal. Resp: clear to auscultation bilaterally Cardio: regular rate and rhythm, S1, S2 normal, no murmur, click, rub or gallop GI:  soft, non-tender; bowel sounds normal; no masses,  no organomegaly Extremities: extremities normal, atraumatic, no cyanosis or edema  ECOG PERFORMANCE STATUS: 1 - Symptomatic but completely ambulatory  Blood pressure 146/90, pulse 102, temperature 98.2 F (36.8 C), temperature source Oral, resp. rate 18, height 5\' 3"  (1.6 m), weight 95 lb 12.8 oz (43.455 kg).  LABORATORY DATA: Lab Results  Component Value Date   WBC 2.0* 03/20/2013   HGB 11.9 03/20/2013   HCT 34.7* 03/20/2013   MCV 87.3 03/20/2013   PLT 145 03/20/2013      Chemistry      Component Value Date/Time   NA 132* 03/20/2013 1436   NA 139 02/26/2013 0927   K 3.8 03/20/2013 1436   K 3.9 02/26/2013 0927   CL 96* 03/20/2013 1436   CL 98 02/26/2013 0927   CO2 26 03/20/2013 1436   CO2 30 02/26/2013 0927   BUN 13.2 03/20/2013 1436   BUN 9 02/26/2013 0927   CREATININE 0.6 03/20/2013 1436   CREATININE 0.73 02/26/2013 0927      Component Value Date/Time   CALCIUM 9.0 03/20/2013 1436   CALCIUM 9.2 02/26/2013 0927   ALKPHOS 119 03/20/2013 1436   ALKPHOS 105 02/26/2013 0927   AST 26 03/20/2013 1436   AST 27 02/26/2013 0927   ALT 31 03/20/2013 1436   ALT 18 02/26/2013 0927   BILITOT 0.65 03/20/2013 1436   BILITOT 0.2* 02/26/2013 0927       RADIOGRAPHIC STUDIES: Mr Laqueta Jean ZO Contrast  April 05, 2013   *RADIOLOGY REPORT*  Clinical Data: Small cell lung cancer, staging.  Weakness.  MRI HEAD WITHOUT AND WITH CONTRAST  Technique:  Multiplanar, multiecho pulse sequences of the brain and surrounding structures were obtained according to standard protocol without and with intravenous contrast  Contrast: 9mL MULTIHANCE GADOBENATE DIMEGLUMINE 529 MG/ML IV SOLN  Comparison: None.  Findings: No acute stroke or hemorrhage.  No hydrocephalus or extra- axial fluid.  Mild premature atrophy.  Extensive chronic microvascular ischemic change in the periventricular and subcortical white matter.  No osseous lesions.  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 No other similar lesions.  Post contrast, a 5 x 7 mm metastasis is identified in the right superior vermis.  No other metastases are identified.  Flow voids are maintained in the major intracranial vessels.  The pituitary and cerebellar tonsils are unremarkable.   Negative orbits.  No sinus or mastoid disease.  IMPRESSION: 5 x 7 mm solitary metastasis right superior vermis. There is minimal surrounding edema.  Incidental right frontal cavernoma.  Mild atrophy with extensive chronic microvascular ischemic change.  No visible osseous lesions.   Original Report Authenticated By: Davonna Belling, M.D.   Ir US Guide Vasc Access Right  03/09/2013   *RADIOLOGY REPORT*  Clinical Data:  Lung cancer  ULTRASOUND GUIDANCE FOR VASCULAR ACCESS RIGHT INTERNAL JUGULAR SINGLE LUMEN POWER PORT CATHETER INSERTION  Date: 03/09/2013 13:00:00  Radiologist:  M. Ruel Favors, M.D.  Medications:  2 grams ancefadministered within 1 hour of the procedure.2 mg Versed, 100 mcg Fentanyl  Guidance:  Ultrasound and fluoroscopic  Fluoroscopy time:  48 seconds  Sedation time:  27 minutes  Contrast volume:  None.  Complications:  No immediate  PROCEDURE/FINDINGS:  Informed consent was obtained from the patient following explanation of the procedure, risks, benefits and alternatives. The patient understands, agrees and consents for the procedure. All questions were addressed.  A time out was performed.  Maximal barrier sterile technique utilized including caps, mask, sterile gowns, sterile gloves, large sterile drape, hand hygiene, and 2% chlorhexidine scrub.  Under sterile conditions and local anesthesia, right internal jugular micropuncture venous access was performed.  Access was performed with ultrasound.  Images were obtained for documentation. A guide wire was inserted followed by a transitional dilator.  This allowed insertion of a guide wire and catheter into the IVC. Measurements were obtained from the SVC / RA junction back to the  right IJ venotomy site.  In the right infraclavicular chest, a subcutaneous pocket was created over the second anterior rib.  This was done under sterile conditions and local anesthesia.  1% lidocaine with epinephrine was utilized for this.  A 2.5 cm incision was made in the skin.  Blunt dissection was performed to create a subcutaneous pocket over the right pectoralis major muscle.  The pocket was flushed with saline vigorously.  There was adequate hemostasis.  The port catheter was assembled and checked for leakage.  The port catheter was secured in the pocket with two retention sutures.  The tubing was tunneled subcutaneously to the right venotomy site and inserted into the SVC/RA junction through a valved peel-away sheath.  Position was confirmed with fluoroscopy. Images were obtained for documentation.  The patient tolerated the procedure well.  No immediate complications.  Incisions were closed in a two layer fashion with 4 - 0 Vicryl suture.  Dermabond was applied to the skin. The port catheter was accessed, blood was aspirated followed by saline and heparin flushes.  Needle was removed.  A dry sterile dressing was applied.  IMPRESSION: Ultrasound and fluoroscopically guided right internal jugular single lumen power port catheter insertion.  Tip in the SVC/RA junction.  Catheter ready for use.   Original Report Authenticated By: Judie Petit. Shick, M.D.   ASSESSMENT AND PLAN: This is a very pleasant 62 years old white female recently diagnosed with extensive stage small cell lung cancer currently undergoing systemic chemotherapy with carboplatin and etoposide status post 1 cycle. The patient is doing fine and tolerating her first cycle of the chemotherapy fairly well. She'll also continue on palliative radiotherapy to the brain lesions under the care of Dr. Basilio Cairo. For depression and lack of appetite, I started the patient on Remeron 30 mg by mouth each bedtime. The patient would come back for followup visit in 2  weeks with the start of the second cycle of her systemic chemotherapy. She was advised to call immediately if she has any concerning symptoms in the interval. All questions were answered. The patient knows to call the clinic with any problems, questions or concerns. We can certainly see the patient much sooner if necessary.

## 2013-03-21 ENCOUNTER — Telehealth: Payer: Self-pay | Admitting: Internal Medicine

## 2013-03-21 ENCOUNTER — Ambulatory Visit: Payer: BC Managed Care – PPO

## 2013-03-21 ENCOUNTER — Telehealth: Payer: Self-pay | Admitting: *Deleted

## 2013-03-21 NOTE — Telephone Encounter (Signed)
Per staff message and POF I have scheduled appts.  JMW  

## 2013-03-21 NOTE — Telephone Encounter (Signed)
lvm for pt the MD added which made it earlier appt....mailed pt avs and letter

## 2013-03-22 ENCOUNTER — Encounter: Payer: Self-pay | Admitting: Radiation Oncology

## 2013-03-22 ENCOUNTER — Ambulatory Visit: Payer: BC Managed Care – PPO | Admitting: Internal Medicine

## 2013-03-22 ENCOUNTER — Ambulatory Visit: Payer: BC Managed Care – PPO

## 2013-03-22 ENCOUNTER — Ambulatory Visit
Admission: RE | Admit: 2013-03-22 | Discharge: 2013-03-22 | Disposition: A | Discharge status: Home / Self Care | Payer: BC Managed Care – PPO | Source: Ambulatory Visit | Attending: Radiation Oncology | Admitting: Radiation Oncology

## 2013-03-22 ENCOUNTER — Encounter: Payer: Self-pay | Admitting: Internal Medicine

## 2013-03-22 VITALS — BP 121/74 | HR 108 | Temp 97.9°F | Resp 20 | Wt 92.9 lb

## 2013-03-22 DIAGNOSIS — C7952 Secondary malignant neoplasm of bone marrow: Secondary | ICD-10-CM

## 2013-03-22 MED ORDER — OXYCODONE HCL 10 MG PO TB12: 10.0000 mg | ORAL_TABLET | Freq: Two times a day (BID) | ORAL | Status: DC

## 2013-03-22 NOTE — Progress Notes (Signed)
Weekly Management Note:  Site: Right hilum and LS-spine Current Dose:  1200/2100  cGy Projected Dose: 2700/3000  cGy  Narrative: The patient is seen today for routine under treatment assessment. CBCT/MVCT images/port films were reviewed. The chart was reviewed.   The patient states that her right buttock pain is improved but she takes oxycodone 5 mg every 3-6 hours. She was given a prescription for 90 tablets just 1 week ago. She is approximately 20-30 tabs remaining.  Physical Examination:  Filed Vitals:   03/22/13 1545  BP: 121/74  Pulse: 108  Temp: 97.9 F (36.6 C)  Resp: 20  .  Weight: 92 lb 14.4 oz (42.139 kg). There is pain described along the right buttock and posterior thigh.  Impression: Tolerating radiation therapy well with improvement of her pain, but she is taking oxycodone every 3-6 hours. I'll start her on OxyContin 10 mg tabs to take 1 by mouth twice a day. Prescription was written for 40 tablets. Plan: Continue radiation therapy as planned.

## 2013-03-22 NOTE — Progress Notes (Signed)
Put disability form on nurse's desk. °

## 2013-03-22 NOTE — Progress Notes (Addendum)
Rad txs 7 completed so far Rt hilum, L5-S4 spine, no c/o pain today, had upset stomach last night, had several bowel movements(got cleaned out), no nausea, drinks 2 ensure plus daily, poor appetite, yogurt last night,  On decadron 4mg  po bid, still has diflucan tabs  1-2 more days worth, requesting refil on Oxycodone, afraid running out, last RX written by Dr. Basilio Cairo 03/15/18 for 90 tabs , 5mg  1 po q 3 h prn  3:53 PM

## 2013-03-23 ENCOUNTER — Ambulatory Visit: Payer: BC Managed Care – PPO

## 2013-03-23 ENCOUNTER — Ambulatory Visit
Admission: RE | Admit: 2013-03-23 | Discharge: 2013-03-23 | Disposition: A | Discharge status: Home / Self Care | Payer: BC Managed Care – PPO | Source: Ambulatory Visit | Attending: Radiation Oncology | Admitting: Radiation Oncology

## 2013-03-24 ENCOUNTER — Ambulatory Visit: Payer: BC Managed Care – PPO

## 2013-03-24 ENCOUNTER — Ambulatory Visit
Admission: RE | Admit: 2013-03-24 | Discharge: 2013-03-24 | Disposition: A | Discharge status: Home / Self Care | Payer: BC Managed Care – PPO | Source: Ambulatory Visit | Attending: Radiation Oncology | Admitting: Radiation Oncology

## 2013-03-26 ENCOUNTER — Encounter: Payer: Self-pay | Admitting: Radiation Oncology

## 2013-03-26 ENCOUNTER — Ambulatory Visit: Payer: BC Managed Care – PPO

## 2013-03-26 ENCOUNTER — Ambulatory Visit
Admission: RE | Admit: 2013-03-26 | Discharge: 2013-03-26 | Disposition: A | Discharge status: Home / Self Care | Payer: BC Managed Care – PPO | Source: Ambulatory Visit | Attending: Radiation Oncology | Admitting: Radiation Oncology

## 2013-03-26 VITALS — BP 118/82 | HR 60 | Temp 97.0°F | Resp 18 | Wt 92.2 lb

## 2013-03-26 DIAGNOSIS — C342 Malignant neoplasm of middle lobe, bronchus or lung: Secondary | ICD-10-CM

## 2013-03-26 DIAGNOSIS — C7951 Secondary malignant neoplasm of bone: Secondary | ICD-10-CM

## 2013-03-26 NOTE — Progress Notes (Signed)
   Weekly Management Note:  outpatient Current Dose:  30 to L-S spine, 24 Gy  To R hilum Projected Dose:30 Gy   Narrative:  The patient presents for routine under treatment assessment.  CBCT/MVCT images/Port film x-rays were reviewed.  The chart was checked. Pain much better in spine. Tapered more quickly than I instructed her, off Decadron completely now, with no withdrawal sx.  Still some dysphagia, no esophagitis.  Takes Oxycontin BID, no oxycodone needed  Physical Findings:  weight is 92 lb 3.2 oz (41.822 kg). Her oral temperature is 97 F (36.1 C). Her blood pressure is 118/82 and her pulse is 60. Her respiration is 18 and oxygen saturation is 100%.  No skin irritation on torso or sacral area  Impression:  The patient is tolerating radiotherapy.  Plan:  Continue radiotherapy as planned. F/u in 7mo  ________________________________   Lonie Peak, M.D.

## 2013-03-26 NOTE — Progress Notes (Signed)
Denies pain at this time. Reports that when she has pain its in her right femur. Reports taking oxy IR and oxycontin to control this pain. Denies diarrhea, constipation, or dizziness. Denies headaches, nausea, vomiting, or unintentional weight loss. Denies night sweats. Reports shortness of breath with exertion. Reports a productive cough with yellow sputum. Reports difficulty swallowing but, denies pain. Not presently taking remeron.

## 2013-03-27 ENCOUNTER — Other Ambulatory Visit (HOSPITAL_BASED_OUTPATIENT_CLINIC_OR_DEPARTMENT_OTHER): Payer: BC Managed Care – PPO

## 2013-03-27 ENCOUNTER — Ambulatory Visit
Admission: RE | Admit: 2013-03-27 | Discharge: 2013-03-27 | Disposition: A | Discharge status: Home / Self Care | Payer: BC Managed Care – PPO | Source: Ambulatory Visit | Attending: Radiation Oncology | Admitting: Radiation Oncology

## 2013-03-27 ENCOUNTER — Ambulatory Visit: Payer: BC Managed Care – PPO

## 2013-03-27 DIAGNOSIS — C342 Malignant neoplasm of middle lobe, bronchus or lung: Secondary | ICD-10-CM

## 2013-03-27 DIAGNOSIS — C3490 Malignant neoplasm of unspecified part of unspecified bronchus or lung: Secondary | ICD-10-CM

## 2013-03-27 DIAGNOSIS — C3491 Malignant neoplasm of unspecified part of right bronchus or lung: Secondary | ICD-10-CM

## 2013-03-27 LAB — CBC WITH DIFFERENTIAL/PLATELET
BASO%: 0.6 % (ref 0.0–2.0)
EOS%: 0.3 % (ref 0.0–7.0)
HCT: 30.7 % — ABNORMAL LOW (ref 34.8–46.6)
MCH: 29.8 pg (ref 25.1–34.0)
MCHC: 35.1 g/dL (ref 31.5–36.0)
MONO#: 0.5 10*3/uL (ref 0.1–0.9)
NEUT%: 87.9 % — ABNORMAL HIGH (ref 38.4–76.8)
RBC: 3.61 10*6/uL — ABNORMAL LOW (ref 3.70–5.45)
RDW: 14.9 % — ABNORMAL HIGH (ref 11.2–14.5)
WBC: 7.1 10*3/uL (ref 3.9–10.3)
lymph#: 0.3 10*3/uL — ABNORMAL LOW (ref 0.9–3.3)

## 2013-03-27 LAB — COMPREHENSIVE METABOLIC PANEL (CC13)
ALT: 22 U/L (ref 0–55)
AST: 16 U/L (ref 5–34)
Albumin: 2.4 g/dL — ABNORMAL LOW (ref 3.5–5.0)
CO2: 27 mEq/L (ref 22–29)
Calcium: 8.8 mg/dL (ref 8.4–10.4)
Chloride: 94 mEq/L — ABNORMAL LOW (ref 98–109)
Creatinine: 0.6 mg/dL (ref 0.6–1.1)
Potassium: 3.3 mEq/L — ABNORMAL LOW (ref 3.5–5.1)
Sodium: 129 mEq/L — ABNORMAL LOW (ref 136–145)
Total Protein: 6 g/dL — ABNORMAL LOW (ref 6.4–8.3)

## 2013-03-28 ENCOUNTER — Ambulatory Visit
Admission: RE | Admit: 2013-03-28 | Discharge: 2013-03-28 | Disposition: A | Discharge status: Home / Self Care | Payer: BC Managed Care – PPO | Source: Ambulatory Visit | Attending: Radiation Oncology | Admitting: Radiation Oncology

## 2013-03-28 ENCOUNTER — Encounter: Payer: Self-pay | Admitting: Radiation Oncology

## 2013-03-28 ENCOUNTER — Ambulatory Visit: Payer: BC Managed Care – PPO

## 2013-03-28 ENCOUNTER — Encounter: Payer: Self-pay | Admitting: Internal Medicine

## 2013-03-28 NOTE — Progress Notes (Signed)
Faxed disability form to [employer] @ [phone] °

## 2013-03-29 ENCOUNTER — Ambulatory Visit: Payer: BC Managed Care – PPO

## 2013-03-31 NOTE — Progress Notes (Signed)
  Radiation Oncology         [phone] ________________________________  Name: Kristina Fischer MRN: [id number]  Date: 03/28/2013  DOB: [birth year]  End of Treatment Note  Diagnosis: Neuroendocrine Carcinoma of the Right Lung, Small Cell Type   Indication for treatment:  Palliative       Radiation treatment dates:   03/08/2013-03/28/2013  Site/dose:    1) Right hilum, subcarina, RML, RLL / 30 Gy in 10 fractions 2) L5-S4 / 30 Gy in 10 fractions  Beams/energy:   1) 3D conformal / 10 and 6 MV photons 2) 3 fields / 15 MV photons  Narrative: The patient tolerated radiation treatment relatively well.  Pain improved significantly in her sacrum.   Plan: The patient has completed radiation treatment. The patient will return to radiation oncology clinic for routine followup in one month. I advised them to call or return sooner if they have any questions or concerns related to their recovery or treatment.  -----------------------------------  Lonie Peak, MD

## 2013-04-03 ENCOUNTER — Ambulatory Visit (HOSPITAL_BASED_OUTPATIENT_CLINIC_OR_DEPARTMENT_OTHER): Payer: BC Managed Care – PPO | Admitting: Internal Medicine

## 2013-04-03 ENCOUNTER — Ambulatory Visit: Payer: BC Managed Care – PPO

## 2013-04-03 ENCOUNTER — Encounter: Payer: Self-pay | Admitting: Internal Medicine

## 2013-04-03 ENCOUNTER — Other Ambulatory Visit: Payer: BC Managed Care – PPO

## 2013-04-03 ENCOUNTER — Telehealth: Payer: Self-pay | Admitting: *Deleted

## 2013-04-03 ENCOUNTER — Other Ambulatory Visit (HOSPITAL_BASED_OUTPATIENT_CLINIC_OR_DEPARTMENT_OTHER): Payer: BC Managed Care – PPO | Admitting: Lab

## 2013-04-03 VITALS — BP 121/71 | HR 126 | Temp 97.5°F | Resp 17 | Ht 63.0 in | Wt 85.4 lb

## 2013-04-03 DIAGNOSIS — R5381 Other malaise: Secondary | ICD-10-CM

## 2013-04-03 DIAGNOSIS — R634 Abnormal weight loss: Secondary | ICD-10-CM

## 2013-04-03 DIAGNOSIS — C342 Malignant neoplasm of middle lobe, bronchus or lung: Secondary | ICD-10-CM

## 2013-04-03 DIAGNOSIS — C3491 Malignant neoplasm of unspecified part of right bronchus or lung: Secondary | ICD-10-CM

## 2013-04-03 DIAGNOSIS — C773 Secondary and unspecified malignant neoplasm of axilla and upper limb lymph nodes: Secondary | ICD-10-CM

## 2013-04-03 DIAGNOSIS — R5383 Other fatigue: Secondary | ICD-10-CM

## 2013-04-03 DIAGNOSIS — C3490 Malignant neoplasm of unspecified part of unspecified bronchus or lung: Secondary | ICD-10-CM

## 2013-04-03 LAB — CBC WITH DIFFERENTIAL/PLATELET
BASO%: 0.2 % (ref 0.0–2.0)
EOS%: 0.1 % (ref 0.0–7.0)
HCT: 36.8 % (ref 34.8–46.6)
MCH: 29.5 pg (ref 25.1–34.0)
MCHC: 34 g/dL (ref 31.5–36.0)
MONO#: 0.9 10*3/uL (ref 0.1–0.9)
NEUT%: 89.8 % — ABNORMAL HIGH (ref 38.4–76.8)
RBC: 4.24 10*6/uL (ref 3.70–5.45)
WBC: 17.3 10*3/uL — ABNORMAL HIGH (ref 3.9–10.3)
lymph#: 0.8 10*3/uL — ABNORMAL LOW (ref 0.9–3.3)
nRBC: 0 % (ref 0–0)

## 2013-04-03 MED ORDER — METHYLPREDNISOLONE 4 MG PO KIT: PACK | ORAL | Status: DC

## 2013-04-03 NOTE — Progress Notes (Signed)
[facility] [phone]   [phone]  OFFICE PROGRESS NOTE  No PCP Per Patient [address] [place] [address]  DIAGNOSIS: Extensive stage small cell lung cancer with bulky mediastinal adenopathy in addition to liver, adrenal, porta hepatis, brain as well as large sacral metastasis diagnosed in June of 2014   PRIOR THERAPY: Palliative radiotherapy to the sacral mass under the care of Dr. Basilio Cairo   CURRENT THERAPY: Systemic chemotherapy with carboplatin for AUC of 5 on day 1 and etoposide 120 mg/M2 on days 1, 2 and 3 with Neulasta support on day 4, status post 1 cycle. First dose was given on 03/13/2013  INTERVAL HISTORY: Kristina Fischer 62 y.o. female returns to the clinic today for followup visit. The patient related the first cycle of her chemotherapy fairly well except for increasing fatigue and weight loss. She lost around 10 pounds since her last visit. The patient has poor appetite. She denied having any significant chest pain or shortness breath, no cough or hemoptysis. The patient denied having any significant nausea or vomiting. She denied having any fever or chills.  MEDICAL HISTORY: Past Medical History  Diagnosis Date  . Arthritis   . Sciatica   . COPD (chronic obstructive pulmonary disease)   . Sacral mass   . Cancer   . Anxiety   . Shortness of breath   . 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 Rheumatoid arthritis   . Difficulty swallowing     ALLERGIES:  has No Known Allergies.  MEDICATIONS:  Current Outpatient Prescriptions  Medication Sig Dispense Refill  . dexamethasone (DECADRON) 4 MG tablet Take 1 tablet (4 mg total) by mouth 2 (two) times daily with a meal.  40 tablet  0  . lidocaine-prilocaine (EMLA) cream Apply topically as needed.  30 g  0  . oxyCODONE (OXYCONTIN) 10 MG 12 hr tablet Take 1 tablet (10 mg total) by mouth every 12 (twelve) hours.   40 tablet  0  . Sennosides (SENNA) 15 MG TABS Take by mouth.      Marland Kitchen albuterol (PROVENTIL HFA;VENTOLIN HFA) 108 (90 BASE) MCG/ACT inhaler Inhale 1-2 puffs into the lungs every 6 (six) hours as needed for wheezing.  1 Inhaler  0  . mirtazapine (REMERON) 30 MG tablet Take 1 tablet (30 mg total) by mouth at bedtime.  30 tablet  2  . OVER THE COUNTER MEDICATION Take 2 capsules by mouth daily. Natural Supplement from health food store for arthritis.      Marland Kitchen oxyCODONE (OXY IR/ROXICODONE) 5 MG immediate release tablet Take 1 tablet (5 mg total) by mouth every 3 (three) hours as needed for pain.  90 tablet  0  . prochlorperazine (COMPAZINE) 10 MG tablet Take 1 tablet (10 mg total) by mouth every 6 (six) hours as needed.  60 tablet  0   No current facility-administered medications for this visit.    SURGICAL HISTORY:  Past Surgical History  Procedure Laterality Date  . Abdominal hysterectomy    . Rhinoplasty      REVIEW OF SYSTEMS:  A comprehensive review of systems was negative except for: Constitutional: positive for anorexia, fatigue and weight loss   PHYSICAL EXAMINATION: General appearance: alert, cooperative, fatigued and no distress Head: Normocephalic, without obvious abnormality, atraumatic Neck: no adenopathy Lymph nodes: Cervical, supraclavicular, and axillary nodes normal. Resp: clear to auscultation bilaterally Cardio: regular  rate and rhythm, S1, S2 normal, no murmur, click, rub or gallop GI: soft, non-tender; bowel sounds normal; no masses,  no organomegaly Extremities: extremities normal, atraumatic, no cyanosis or edema  ECOG PERFORMANCE STATUS: 1 - Symptomatic but completely ambulatory  Blood pressure 121/71, pulse 126, temperature 97.5 F (36.4 C), temperature source Oral, resp. rate 17, height 5\' 3"  (1.6 m), weight 85 lb 6.4 oz (38.737 kg), SpO2 99.00%.  LABORATORY DATA: Lab Results  Component Value Date   WBC 17.3* 04/03/2013   HGB 12.5 04/03/2013   HCT 36.8 04/03/2013    MCV 86.8 04/03/2013   PLT 284 04/03/2013      Chemistry      Component Value Date/Time   NA 129* 03/27/2013 1110   NA 139 02/26/2013 0927   K 3.3* 03/27/2013 1110   K 3.9 02/26/2013 0927   CL 96* 03/20/2013 1436   CL 98 02/26/2013 0927   CO2 27 03/27/2013 1110   CO2 30 02/26/2013 0927   BUN 11.3 03/27/2013 1110   BUN 9 02/26/2013 0927   CREATININE 0.6 03/27/2013 1110   CREATININE 0.73 02/26/2013 0927      Component Value Date/Time   CALCIUM 8.8 03/27/2013 1110   CALCIUM 9.2 02/26/2013 0927   ALKPHOS 130 03/27/2013 1110   ALKPHOS 105 02/26/2013 0927   AST 16 03/27/2013 1110   AST 27 02/26/2013 0927   ALT 22 03/27/2013 1110   ALT 18 02/26/2013 0927   BILITOT 0.37 03/27/2013 1110   BILITOT 0.2* 02/26/2013 0927       RADIOGRAPHIC STUDIES: Mr Laqueta Jean WU Contrast  03/19/13   *RADIOLOGY REPORT*  Clinical Data: Small cell lung cancer, staging.  Weakness.  MRI HEAD WITHOUT AND WITH CONTRAST  Technique:  Multiplanar, multiecho pulse sequences of the brain and surrounding structures were obtained according to standard protocol without and with intravenous contrast  Contrast: 9mL MULTIHANCE GADOBENATE DIMEGLUMINE 529 MG/ML IV SOLN  Comparison: None.  Findings: No acute stroke or hemorrhage.  No hydrocephalus or extra- axial fluid.  Mild premature atrophy.  Extensive chronic microvascular ischemic change in the periventricular and subcortical white matter.  No osseous lesions.  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 No other similar lesions.  Post contrast, a 5 x 7 mm metastasis is identified in the right superior vermis.  No other metastases are identified.  Flow voids are maintained in the major intracranial vessels.  The pituitary and cerebellar tonsils are unremarkable.   Negative orbits.  No sinus or mastoid disease.  IMPRESSION: 5 x 7 mm solitary metastasis right superior vermis. There is minimal surrounding edema.  Incidental right frontal cavernoma.  Mild atrophy with extensive chronic microvascular  ischemic change.  No visible osseous lesions.   Original Report Authenticated By: Davonna Belling, M.D.   Ir Fluoro Guide Cv Line Right  03/09/2013   *RADIOLOGY REPORT*  Clinical Data:  Lung cancer  ULTRASOUND GUIDANCE FOR VASCULAR ACCESS RIGHT INTERNAL JUGULAR SINGLE LUMEN POWER PORT CATHETER INSERTION  Date: 03/09/2013 13:00:00  Radiologist:  M. Ruel Favors, M.D.  Medications:  2 grams ancefadministered within 1 hour of the procedure.2 mg Versed, 100 mcg Fentanyl  Guidance:  Ultrasound and fluoroscopic  Fluoroscopy time:  48 seconds  Sedation time:  27 minutes  Contrast volume:  None.  Complications:  No immediate  PROCEDURE/FINDINGS:  Informed consent was obtained from the patient following explanation of the procedure, risks, benefits and alternatives. The patient understands, agrees and consents for the procedure. All questions were  addressed.  A time out was performed.  Maximal barrier sterile technique utilized including caps, mask, sterile gowns, sterile gloves, large sterile drape, hand hygiene, and 2% chlorhexidine scrub.  Under sterile conditions and local anesthesia, right internal jugular micropuncture venous access was performed.  Access was performed with ultrasound.  Images were obtained for documentation. A guide wire was inserted followed by a transitional dilator.  This allowed insertion of a guide wire and catheter into the IVC. Measurements were obtained from the SVC / RA junction back to the right IJ venotomy site.  In the right infraclavicular chest, a subcutaneous pocket was created over the second anterior rib.  This was done under sterile conditions and local anesthesia.  1% lidocaine with epinephrine was utilized for this.  A 2.5 cm incision was made in the skin.  Blunt dissection was performed to create a subcutaneous pocket over the right pectoralis major muscle.  The pocket was flushed with saline vigorously.  There was adequate hemostasis.  The port catheter was assembled and checked  for leakage.  The port catheter was secured in the pocket with two retention sutures.  The tubing was tunneled subcutaneously to the right venotomy site and inserted into the SVC/RA junction through a valved peel-away sheath.  Position was confirmed with fluoroscopy. Images were obtained for documentation.  The patient tolerated the procedure well.  No immediate complications.  Incisions were closed in a two layer fashion with 4 - 0 Vicryl suture.  Dermabond was applied to the skin. The port catheter was accessed, blood was aspirated followed by saline and heparin flushes.  Needle was removed.  A dry sterile dressing was applied.  IMPRESSION: Ultrasound and fluoroscopically guided right internal jugular single lumen power port catheter insertion.  Tip in the SVC/RA junction.  Catheter ready for use.   Original Report Authenticated By: Judie Petit. Miles Costain, M.D.   Ir US Guide Vasc Access Right  03/09/2013   *RADIOLOGY REPORT*  Clinical Data:  Lung cancer  ULTRASOUND GUIDANCE FOR VASCULAR ACCESS RIGHT INTERNAL JUGULAR SINGLE LUMEN POWER PORT CATHETER INSERTION  Date: 03/09/2013 13:00:00  Radiologist:  M. Ruel Favors, M.D.  Medications:  2 grams ancefadministered within 1 hour of the procedure.2 mg Versed, 100 mcg Fentanyl  Guidance:  Ultrasound and fluoroscopic  Fluoroscopy time:  48 seconds  Sedation time:  27 minutes  Contrast volume:  None.  Complications:  No immediate  PROCEDURE/FINDINGS:  Informed consent was obtained from the patient following explanation of the procedure, risks, benefits and alternatives. The patient understands, agrees and consents for the procedure. All questions were addressed.  A time out was performed.  Maximal barrier sterile technique utilized including caps, mask, sterile gowns, sterile gloves, large sterile drape, hand hygiene, and 2% chlorhexidine scrub.  Under sterile conditions and local anesthesia, right internal jugular micropuncture venous access was performed.  Access was performed with  ultrasound.  Images were obtained for documentation. A guide wire was inserted followed by a transitional dilator.  This allowed insertion of a guide wire and catheter into the IVC. Measurements were obtained from the SVC / RA junction back to the right IJ venotomy site.  In the right infraclavicular chest, a subcutaneous pocket was created over the second anterior rib.  This was done under sterile conditions and local anesthesia.  1% lidocaine with epinephrine was utilized for this.  A 2.5 cm incision was made in the skin.  Blunt dissection was performed to create a subcutaneous pocket over the right pectoralis major muscle.  The  pocket was flushed with saline vigorously.  There was adequate hemostasis.  The port catheter was assembled and checked for leakage.  The port catheter was secured in the pocket with two retention sutures.  The tubing was tunneled subcutaneously to the right venotomy site and inserted into the SVC/RA junction through a valved peel-away sheath.  Position was confirmed with fluoroscopy. Images were obtained for documentation.  The patient tolerated the procedure well.  No immediate complications.  Incisions were closed in a two layer fashion with 4 - 0 Vicryl suture.  Dermabond was applied to the skin. The port catheter was accessed, blood was aspirated followed by saline and heparin flushes.  Needle was removed.  A dry sterile dressing was applied.  IMPRESSION: Ultrasound and fluoroscopically guided right internal jugular single lumen power port catheter insertion.  Tip in the SVC/RA junction.  Catheter ready for use.   Original Report Authenticated By: Judie Petit. Shick, M.D.    ASSESSMENT AND PLAN: This is a very pleasant 62 years old white female with extensive stage small cell lung cancer currently undergoing systemic chemotherapy with carboplatin and etoposide status post 1 cycle. The patient tolerated her treatment fairly well except for the fatigue and significant weight loss. I  recommended for the patient to delay the start of the second cycle of her chemotherapy till next week because of her significant weakness and fatigue and weight loss. For the weight loss, I would start the patient on Medrol Dosepak. The patient would come back for followup visit in one week for evaluation before starting the second cycle of her chemotherapy. She was advised to call immediately if she has any concerning symptoms in the interval. All questions were answered. The patient knows to call the clinic with any problems, questions or concerns. We can certainly see the patient much sooner if necessary.

## 2013-04-03 NOTE — Patient Instructions (Signed)
I will delay the start of cycle #2 till next week because of your significant fatigue and weight loss. Followup visit in one week.

## 2013-04-03 NOTE — Telephone Encounter (Signed)
Per staff message and POF I have scheduled appts.  JMW  

## 2013-04-04 ENCOUNTER — Ambulatory Visit: Payer: BC Managed Care – PPO

## 2013-04-04 ENCOUNTER — Encounter: Payer: BC Managed Care – PPO | Admitting: Nutrition

## 2013-04-05 ENCOUNTER — Ambulatory Visit: Payer: BC Managed Care – PPO

## 2013-04-05 ENCOUNTER — Telehealth: Payer: Self-pay | Admitting: Internal Medicine

## 2013-04-05 NOTE — Telephone Encounter (Signed)
Talked to pt's daughter gave her appt for 7/15 lab MD and chemo

## 2013-04-06 ENCOUNTER — Ambulatory Visit: Payer: BC Managed Care – PPO

## 2013-04-10 ENCOUNTER — Other Ambulatory Visit (HOSPITAL_BASED_OUTPATIENT_CLINIC_OR_DEPARTMENT_OTHER): Payer: BC Managed Care – PPO

## 2013-04-10 ENCOUNTER — Encounter: Payer: Self-pay | Admitting: Internal Medicine

## 2013-04-10 ENCOUNTER — Ambulatory Visit (HOSPITAL_BASED_OUTPATIENT_CLINIC_OR_DEPARTMENT_OTHER): Payer: BC Managed Care – PPO | Admitting: Internal Medicine

## 2013-04-10 ENCOUNTER — Telehealth: Payer: Self-pay | Admitting: *Deleted

## 2013-04-10 ENCOUNTER — Ambulatory Visit (HOSPITAL_BASED_OUTPATIENT_CLINIC_OR_DEPARTMENT_OTHER): Payer: BC Managed Care – PPO

## 2013-04-10 VITALS — BP 114/80 | HR 121 | Temp 98.1°F | Resp 20 | Ht 63.0 in | Wt 86.5 lb

## 2013-04-10 DIAGNOSIS — C787 Secondary malignant neoplasm of liver and intrahepatic bile duct: Secondary | ICD-10-CM

## 2013-04-10 DIAGNOSIS — R5381 Other malaise: Secondary | ICD-10-CM

## 2013-04-10 DIAGNOSIS — C7931 Secondary malignant neoplasm of brain: Secondary | ICD-10-CM

## 2013-04-10 DIAGNOSIS — C7970 Secondary malignant neoplasm of unspecified adrenal gland: Secondary | ICD-10-CM

## 2013-04-10 DIAGNOSIS — C3490 Malignant neoplasm of unspecified part of unspecified bronchus or lung: Secondary | ICD-10-CM

## 2013-04-10 DIAGNOSIS — F329 Major depressive disorder, single episode, unspecified: Secondary | ICD-10-CM

## 2013-04-10 DIAGNOSIS — Z5111 Encounter for antineoplastic chemotherapy: Secondary | ICD-10-CM

## 2013-04-10 DIAGNOSIS — C7952 Secondary malignant neoplasm of bone marrow: Secondary | ICD-10-CM

## 2013-04-10 DIAGNOSIS — C3491 Malignant neoplasm of unspecified part of right bronchus or lung: Secondary | ICD-10-CM

## 2013-04-10 DIAGNOSIS — C7951 Secondary malignant neoplasm of bone: Secondary | ICD-10-CM

## 2013-04-10 DIAGNOSIS — C342 Malignant neoplasm of middle lobe, bronchus or lung: Secondary | ICD-10-CM

## 2013-04-10 LAB — COMPREHENSIVE METABOLIC PANEL (CC13)
ALT: 35 U/L (ref 0–55)
AST: 21 U/L (ref 5–34)
Albumin: 2.5 g/dL — ABNORMAL LOW (ref 3.5–5.0)
Alkaline Phosphatase: 137 U/L (ref 40–150)
Calcium: 8.7 mg/dL (ref 8.4–10.4)
Chloride: 98 mEq/L (ref 98–109)
Potassium: 3.7 mEq/L (ref 3.5–5.1)
Sodium: 136 mEq/L (ref 136–145)
Total Protein: 6.3 g/dL — ABNORMAL LOW (ref 6.4–8.3)

## 2013-04-10 LAB — CBC WITH DIFFERENTIAL/PLATELET
EOS%: 0.3 % (ref 0.0–7.0)
MCH: 30.1 pg (ref 25.1–34.0)
MCV: 86.9 fL (ref 79.5–101.0)
MONO%: 6 % (ref 0.0–14.0)
NEUT#: 13.2 10*3/uL — ABNORMAL HIGH (ref 1.5–6.5)
RBC: 3.85 10*6/uL (ref 3.70–5.45)
RDW: 15.9 % — ABNORMAL HIGH (ref 11.2–14.5)
lymph#: 1 10*3/uL (ref 0.9–3.3)

## 2013-04-10 MED ORDER — ONDANSETRON 16 MG/50ML IVPB (CHCC)
16.0000 mg | Freq: Once | INTRAVENOUS | Status: AC
  Administered 2013-04-10: 16 mg via INTRAVENOUS

## 2013-04-10 MED ORDER — OXYCODONE HCL 10 MG PO TB12: 10.0000 mg | ORAL_TABLET | Freq: Two times a day (BID) | ORAL | Status: AC

## 2013-04-10 MED ORDER — SODIUM CHLORIDE 0.9 % IV SOLN
120.0000 mg/m2 | Freq: Once | INTRAVENOUS | Status: AC
  Administered 2013-04-10: 170 mg via INTRAVENOUS
  Filled 2013-04-10: qty 8.5

## 2013-04-10 MED ORDER — SODIUM CHLORIDE 0.9 % IJ SOLN
10.0000 mL | INTRAMUSCULAR | Status: DC | PRN
  Administered 2013-04-10: 10 mL
  Filled 2013-04-10: qty 10

## 2013-04-10 MED ORDER — HEPARIN SOD (PORK) LOCK FLUSH 100 UNIT/ML IV SOLN
500.0000 [IU] | Freq: Once | INTRAVENOUS | Status: AC | PRN
  Administered 2013-04-10: 500 [IU]
  Filled 2013-04-10: qty 5

## 2013-04-10 MED ORDER — SODIUM CHLORIDE 0.9 % IV SOLN
Freq: Once | INTRAVENOUS | Status: AC
  Administered 2013-04-10: 10:00:00 via INTRAVENOUS

## 2013-04-10 MED ORDER — SODIUM CHLORIDE 0.9 % IV SOLN
429.5000 mg | Freq: Once | INTRAVENOUS | Status: AC
  Administered 2013-04-10: 430 mg via INTRAVENOUS
  Filled 2013-04-10: qty 43

## 2013-04-10 MED ORDER — DEXAMETHASONE SODIUM PHOSPHATE 20 MG/5ML IJ SOLN
20.0000 mg | Freq: Once | INTRAMUSCULAR | Status: AC
  Administered 2013-04-10: 20 mg via INTRAVENOUS

## 2013-04-10 NOTE — Patient Instructions (Addendum)
[facility] Discharge Instructions for Patients Receiving Chemotherapy  Today you received the following chemotherapy agents Carboplatin and VP-16.  To help prevent nausea and vomiting after your treatment, we encourage you to take your nausea medication.  If you develop nausea and vomiting that is not controlled by your nausea medication, call the clinic.   BELOW ARE SYMPTOMS THAT SHOULD BE REPORTED IMMEDIATELY:  *FEVER GREATER THAN 100.5 F  *CHILLS WITH OR WITHOUT FEVER  NAUSEA AND VOMITING THAT IS NOT CONTROLLED WITH YOUR NAUSEA MEDICATION  *UNUSUAL SHORTNESS OF BREATH  *UNUSUAL BRUISING OR BLEEDING  TENDERNESS IN MOUTH AND THROAT WITH OR WITHOUT PRESENCE OF ULCERS  *URINARY PROBLEMS  *BOWEL PROBLEMS  UNUSUAL RASH Items with * indicate a potential emergency and should be followed up as soon as possible.  Feel free to call the clinic you have any questions or concerns. The clinic phone number is [phone].    

## 2013-04-10 NOTE — Progress Notes (Signed)
[facility] Telephone:([phone]) [phone]   Fax:(336) [phone]  OFFICE PROGRESS NOTE  No PCP Per Patient [address] [place] [address]  DIAGNOSIS: Extensive stage small cell lung cancer with bulky mediastinal adenopathy in addition to liver, adrenal, porta hepatis, brain as well as large sacral metastasis diagnosed in June of 2014   PRIOR THERAPY: Palliative radiotherapy to the sacral mass under the care of Dr. Basilio Cairo   CURRENT THERAPY: Systemic chemotherapy with carboplatin for AUC of 5 on day 1 and etoposide 120 mg/M2 on days 1, 2 and 3 with Neulasta support on day 4, status post 1 cycles. First dose was given on 03/13/2013   INTERVAL HISTORY: Kristina Fischer 62 y.o. female returns to the clinic today for followup visit. The patient is feeling fine today with no specific complaints except for fatigue and generalized weakness. She has been off treatment for the last week to give her more time to recover. She is feeling a little bit better. She did having any significant chest pain, shortness of breath, cough or hemoptysis. The patient denied having any fever or chills. She denied having any nausea or vomiting. She is here to start cycle #2 of her chemotherapy.  MEDICAL HISTORY: Past Medical History  Diagnosis Date  . Arthritis   . Sciatica   . COPD (chronic obstructive pulmonary disease)   . Sacral mass   . Cancer   . Anxiety   . Shortness of breath   . 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 Rheumatoid arthritis   . Difficulty swallowing     ALLERGIES:  has No Known Allergies.  MEDICATIONS:  Current Outpatient Prescriptions  Medication Sig Dispense Refill  . lidocaine-prilocaine (EMLA) cream Apply topically as needed.  30 g  0  . mirtazapine (REMERON) 30 MG tablet Take 1 tablet (30 mg total) by mouth at bedtime.  30 tablet  2  . OVER THE COUNTER MEDICATION Take 2 capsules by mouth daily.  Natural Supplement from health food store for arthritis.      Marland Kitchen oxyCODONE (OXY IR/ROXICODONE) 5 MG immediate release tablet Take 1 tablet (5 mg total) by mouth every 3 (three) hours as needed for pain.  90 tablet  0  . oxyCODONE (OXYCONTIN) 10 MG 12 hr tablet Take 1 tablet (10 mg total) by mouth every 12 (twelve) hours.  40 tablet  0  . prochlorperazine (COMPAZINE) 10 MG tablet Take 1 tablet (10 mg total) by mouth every 6 (six) hours as needed.  60 tablet  0  . senna (SENOKOT) 8.6 MG tablet Take 1 tablet by mouth daily as needed for constipation.       No current facility-administered medications for this visit.    SURGICAL HISTORY:  Past Surgical History  Procedure Laterality Date  . Abdominal hysterectomy    . Rhinoplasty      REVIEW OF SYSTEMS:  A comprehensive review of systems was negative except for: Constitutional: positive for anorexia, fatigue and weight loss   PHYSICAL EXAMINATION: General appearance: alert, cooperative, fatigued and no distress Head: Normocephalic, without obvious abnormality, atraumatic Neck: no adenopathy Lymph nodes: Cervical, supraclavicular, and axillary nodes normal. Resp: clear to auscultation bilaterally Cardio: regular rate and rhythm, S1, S2 normal, no murmur, click, rub or gallop GI: soft, non-tender; bowel sounds normal; no masses,  no organomegaly Extremities: extremities normal, atraumatic, no cyanosis or edema  ECOG PERFORMANCE  STATUS: 1 - Symptomatic but completely ambulatory  Blood pressure 114/80, pulse 121, temperature 98.1 F (36.7 C), temperature source Oral, resp. rate 20, height 5\' 3"  (1.6 m), weight 86 lb 8 oz (39.236 kg).  LABORATORY DATA: Lab Results  Component Value Date   WBC 15.2* 04/10/2013   HGB 11.6 04/10/2013   HCT 33.5* 04/10/2013   MCV 86.9 04/10/2013   PLT 496* 04/10/2013      Chemistry      Component Value Date/Time   NA 136 04/10/2013 0754   NA 139 02/26/2013 0927   K 3.7 04/10/2013 0754   K 3.9 02/26/2013 0927    CL 96* 03/20/2013 1436   CL 98 02/26/2013 0927   CO2 28 04/10/2013 0754   CO2 30 02/26/2013 0927   BUN 11.3 04/10/2013 0754   BUN 9 02/26/2013 0927   CREATININE 0.7 04/10/2013 0754   CREATININE 0.73 02/26/2013 0927      Component Value Date/Time   CALCIUM 8.7 04/10/2013 0754   CALCIUM 9.2 02/26/2013 0927   ALKPHOS 137 04/10/2013 0754   ALKPHOS 105 02/26/2013 0927   AST 21 04/10/2013 0754   AST 27 02/26/2013 0927   ALT 35 04/10/2013 0754   ALT 18 02/26/2013 0927   BILITOT <0.20 Repeated and Verified 04/10/2013 0754   BILITOT 0.2* 02/26/2013 0927       RADIOGRAPHIC STUDIES: Mr Laqueta Jean WG Contrast  2013/03/14   *RADIOLOGY REPORT*  Clinical Data: Small cell lung cancer, staging.  Weakness.  MRI HEAD WITHOUT AND WITH CONTRAST  Technique:  Multiplanar, multiecho pulse sequences of the brain and surrounding structures were obtained according to standard protocol without and with intravenous contrast  Contrast: 9mL MULTIHANCE GADOBENATE DIMEGLUMINE 529 MG/ML IV SOLN  Comparison: None.  Findings: No acute stroke or hemorrhage.  No hydrocephalus or extra- axial fluid.  Mild premature atrophy.  Extensive chronic microvascular ischemic change in the periventricular and subcortical white matter.  No osseous lesions.  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 No other similar lesions.  Post contrast, a 5 x 7 mm metastasis is identified in the right superior vermis.  No other metastases are identified.  Flow voids are maintained in the major intracranial vessels.  The pituitary and cerebellar tonsils are unremarkable.   Negative orbits.  No sinus or mastoid disease.  IMPRESSION: 5 x 7 mm solitary metastasis right superior vermis. There is minimal surrounding edema.  Incidental right frontal cavernoma.  Mild atrophy with extensive chronic microvascular ischemic change.  No visible osseous lesions.   Original Report Authenticated By: Davonna Belling, M.D.    ASSESSMENT AND PLAN: This is a very pleasant 62 years  old white female with extensive stage small cell lung cancer currently undergoing systemic chemotherapy with carboplatin and etoposide status post 1 cycle. The patient is feeling a little bit better today and she is ready to proceed with cycle #2 as scheduled. She would come back for followup visit in 3 weeks with the next cycle of her chemotherapy after repeating CT scan of the chest, abdomen and pelvis for restaging of her disease. For depression and back of appetite, the patient will continue on Remeron. She was advised to call immediately if she has any concerning symptoms in the interval.  All questions were answered. The patient knows to call the clinic with any problems, questions or concerns. We can certainly see the patient much sooner if necessary.

## 2013-04-10 NOTE — Telephone Encounter (Signed)
Per staff phone call and POF I have schedueld appts.  JMW  

## 2013-04-10 NOTE — Patient Instructions (Addendum)
Continue chemotherapy today as scheduled.  Followup visit in 3 weeks with repeat CT scan of the chest, abdomen and pelvis. 

## 2013-04-11 ENCOUNTER — Ambulatory Visit: Payer: BC Managed Care – PPO | Admitting: Nutrition

## 2013-04-11 ENCOUNTER — Ambulatory Visit (HOSPITAL_BASED_OUTPATIENT_CLINIC_OR_DEPARTMENT_OTHER): Payer: BC Managed Care – PPO

## 2013-04-11 ENCOUNTER — Telehealth: Payer: Self-pay | Admitting: Internal Medicine

## 2013-04-11 VITALS — BP 124/78 | HR 110 | Temp 98.7°F | Resp 18

## 2013-04-11 DIAGNOSIS — Z5111 Encounter for antineoplastic chemotherapy: Secondary | ICD-10-CM

## 2013-04-11 DIAGNOSIS — C7B8 Other secondary neuroendocrine tumors: Secondary | ICD-10-CM

## 2013-04-11 DIAGNOSIS — C7A090 Malignant carcinoid tumor of the bronchus and lung: Secondary | ICD-10-CM

## 2013-04-11 DIAGNOSIS — C787 Secondary malignant neoplasm of liver and intrahepatic bile duct: Secondary | ICD-10-CM

## 2013-04-11 DIAGNOSIS — C3490 Malignant neoplasm of unspecified part of unspecified bronchus or lung: Secondary | ICD-10-CM

## 2013-04-11 MED ORDER — ONDANSETRON 8 MG/50ML IVPB (CHCC)
8.0000 mg | Freq: Once | INTRAVENOUS | Status: AC
  Administered 2013-04-11: 8 mg via INTRAVENOUS

## 2013-04-11 MED ORDER — DEXAMETHASONE SODIUM PHOSPHATE 10 MG/ML IJ SOLN
10.0000 mg | Freq: Once | INTRAMUSCULAR | Status: AC
  Administered 2013-04-11: 10 mg via INTRAVENOUS

## 2013-04-11 MED ORDER — HEPARIN SOD (PORK) LOCK FLUSH 100 UNIT/ML IV SOLN
500.0000 [IU] | Freq: Once | INTRAVENOUS | Status: AC | PRN
  Administered 2013-04-11: 500 [IU]
  Filled 2013-04-11: qty 5

## 2013-04-11 MED ORDER — SODIUM CHLORIDE 0.9 % IJ SOLN
10.0000 mL | INTRAMUSCULAR | Status: DC | PRN
  Administered 2013-04-11: 10 mL
  Filled 2013-04-11: qty 10

## 2013-04-11 MED ORDER — SODIUM CHLORIDE 0.9 % IV SOLN
Freq: Once | INTRAVENOUS | Status: AC
  Administered 2013-04-11: 10:00:00 via INTRAVENOUS

## 2013-04-11 MED ORDER — SODIUM CHLORIDE 0.9 % IV SOLN
120.0000 mg/m2 | Freq: Once | INTRAVENOUS | Status: AC
  Administered 2013-04-11: 170 mg via INTRAVENOUS
  Filled 2013-04-11: qty 8.5

## 2013-04-11 NOTE — Progress Notes (Signed)
Visited pt during chemo tx.  Pt states she is doing her best to eat and drink a lot, but has never been a big eater or snack person.  Pt c/o food tasting too sweet and things being hard to swallow.  Drinking 2 Ensure plus/day most days, but does not like the taste.  Wt 86.5# down 14.5# since last nutrition assessment.    Nutrition Dx:  Inadequate oral intake and unintended wt loss continues.  Intervention:  Strongly encouraged pt to increase po intake of high kcal/high protein meals and snacks to promote wt maintenance.  Recommend consume at minimum 2 Ensure plus/day, more would be ideal, if tolerated.  Suggested homemade milkshakes and smoothies made with other flavors.  Provided information sheets on ways to cope with poor appetite and changes in taste.  Recipes also provided.  All questions answered and Teach back method used.    Monitoring, evaluation and goals: Pt will tolerate increased calories and protein along with shakes and supplements to prevent further wt loss.    Next visit: To be scheduled with chemo.

## 2013-04-11 NOTE — Telephone Encounter (Signed)
s.w. pt daughter to get updated sched at nxt visit...she said her mom already has an updated sched.

## 2013-04-11 NOTE — Patient Instructions (Addendum)
[facility] Discharge Instructions for Patients Receiving Chemotherapy  Today you received the following chemotherapy agents: Etoposide.  To help prevent nausea and vomiting after your treatment, we encourage you to take your nausea medication as prescribed.   If you develop nausea and vomiting that is not controlled by your nausea medication, call the clinic.   BELOW ARE SYMPTOMS THAT SHOULD BE REPORTED IMMEDIATELY:  *FEVER GREATER THAN 100.5 F  *CHILLS WITH OR WITHOUT FEVER  NAUSEA AND VOMITING THAT IS NOT CONTROLLED WITH YOUR NAUSEA MEDICATION  *UNUSUAL SHORTNESS OF BREATH  *UNUSUAL BRUISING OR BLEEDING  TENDERNESS IN MOUTH AND THROAT WITH OR WITHOUT PRESENCE OF ULCERS  *URINARY PROBLEMS  *BOWEL PROBLEMS  UNUSUAL RASH Items with * indicate a potential emergency and should be followed up as soon as possible.  Feel free to call the clinic you have any questions or concerns. The clinic phone number is [phone].    

## 2013-04-12 ENCOUNTER — Ambulatory Visit (HOSPITAL_BASED_OUTPATIENT_CLINIC_OR_DEPARTMENT_OTHER): Payer: BC Managed Care – PPO

## 2013-04-12 ENCOUNTER — Telehealth: Payer: Self-pay | Admitting: Internal Medicine

## 2013-04-12 VITALS — BP 121/65 | HR 114 | Temp 97.8°F

## 2013-04-12 DIAGNOSIS — C7A090 Malignant carcinoid tumor of the bronchus and lung: Secondary | ICD-10-CM

## 2013-04-12 DIAGNOSIS — C3490 Malignant neoplasm of unspecified part of unspecified bronchus or lung: Secondary | ICD-10-CM

## 2013-04-12 DIAGNOSIS — C787 Secondary malignant neoplasm of liver and intrahepatic bile duct: Secondary | ICD-10-CM

## 2013-04-12 DIAGNOSIS — C7B8 Other secondary neuroendocrine tumors: Secondary | ICD-10-CM

## 2013-04-12 DIAGNOSIS — Z5111 Encounter for antineoplastic chemotherapy: Secondary | ICD-10-CM

## 2013-04-12 MED ORDER — SODIUM CHLORIDE 0.9 % IV SOLN
Freq: Once | INTRAVENOUS | Status: AC
  Administered 2013-04-12: 13:00:00 via INTRAVENOUS

## 2013-04-12 MED ORDER — DEXAMETHASONE SODIUM PHOSPHATE 10 MG/ML IJ SOLN
10.0000 mg | Freq: Once | INTRAMUSCULAR | Status: AC
  Administered 2013-04-12: 10 mg via INTRAVENOUS

## 2013-04-12 MED ORDER — ONDANSETRON 8 MG/50ML IVPB (CHCC)
8.0000 mg | Freq: Once | INTRAVENOUS | Status: AC
Start: 2013-04-12 — End: 2013-04-12
  Administered 2013-04-12: 8 mg via INTRAVENOUS

## 2013-04-12 MED ORDER — HEPARIN SOD (PORK) LOCK FLUSH 100 UNIT/ML IV SOLN
500.0000 [IU] | Freq: Once | INTRAVENOUS | Status: AC | PRN
  Administered 2013-04-12: 500 [IU]
  Filled 2013-04-12: qty 5

## 2013-04-12 MED ORDER — SODIUM CHLORIDE 0.9 % IJ SOLN
10.0000 mL | INTRAMUSCULAR | Status: DC | PRN
  Administered 2013-04-12: 10 mL
  Filled 2013-04-12: qty 10

## 2013-04-12 MED ORDER — SODIUM CHLORIDE 0.9 % IV SOLN
120.0000 mg/m2 | Freq: Once | INTRAVENOUS | Status: AC
  Administered 2013-04-12: 170 mg via INTRAVENOUS
  Filled 2013-04-12: qty 8.5

## 2013-04-12 NOTE — Patient Instructions (Addendum)
[facility] Discharge Instructions for Patients Receiving Chemotherapy  Today you received the following chemotherapy agents:  Etoposide  To help prevent nausea and vomiting after your treatment, we encourage you to take your nausea medication as ordered per MD.   If you develop nausea and vomiting that is not controlled by your nausea medication, call the clinic.   BELOW ARE SYMPTOMS THAT SHOULD BE REPORTED IMMEDIATELY:  *FEVER GREATER THAN 100.5 F  *CHILLS WITH OR WITHOUT FEVER  NAUSEA AND VOMITING THAT IS NOT CONTROLLED WITH YOUR NAUSEA MEDICATION  *UNUSUAL SHORTNESS OF BREATH  *UNUSUAL BRUISING OR BLEEDING  TENDERNESS IN MOUTH AND THROAT WITH OR WITHOUT PRESENCE OF ULCERS  *URINARY PROBLEMS  *BOWEL PROBLEMS  UNUSUAL RASH Items with * indicate a potential emergency and should be followed up as soon as possible.  Feel free to call the clinic you have any questions or concerns. The clinic phone number is [phone].    

## 2013-04-12 NOTE — Telephone Encounter (Signed)
per Darol Destine email add nut appt.Marland KitchenMarland KitchenMarland KitchenDone

## 2013-04-13 ENCOUNTER — Ambulatory Visit (HOSPITAL_BASED_OUTPATIENT_CLINIC_OR_DEPARTMENT_OTHER): Payer: BC Managed Care – PPO

## 2013-04-13 ENCOUNTER — Encounter: Payer: Self-pay | Admitting: Internal Medicine

## 2013-04-13 VITALS — BP 137/79 | HR 110 | Temp 98.1°F

## 2013-04-13 DIAGNOSIS — C7970 Secondary malignant neoplasm of unspecified adrenal gland: Secondary | ICD-10-CM

## 2013-04-13 DIAGNOSIS — C342 Malignant neoplasm of middle lobe, bronchus or lung: Secondary | ICD-10-CM

## 2013-04-13 DIAGNOSIS — Z5189 Encounter for other specified aftercare: Secondary | ICD-10-CM

## 2013-04-13 DIAGNOSIS — C787 Secondary malignant neoplasm of liver and intrahepatic bile duct: Secondary | ICD-10-CM

## 2013-04-13 DIAGNOSIS — C3490 Malignant neoplasm of unspecified part of unspecified bronchus or lung: Secondary | ICD-10-CM

## 2013-04-13 MED ORDER — PEGFILGRASTIM INJECTION 6 MG/0.6ML
6.0000 mg | Freq: Once | SUBCUTANEOUS | Status: AC
  Administered 2013-04-13: 6 mg via SUBCUTANEOUS
  Filled 2013-04-13: qty 0.6

## 2013-04-13 NOTE — Progress Notes (Signed)
Patient came in to say she has lots of bills piling up and was called by someone about them. I advised her possibly billing. I gave her ph# to call and see if she can get financial asst. She feels her bills are over 5000.00. It is just her and she is working.

## 2013-04-17 ENCOUNTER — Other Ambulatory Visit (HOSPITAL_BASED_OUTPATIENT_CLINIC_OR_DEPARTMENT_OTHER): Payer: BC Managed Care – PPO

## 2013-04-17 DIAGNOSIS — C342 Malignant neoplasm of middle lobe, bronchus or lung: Secondary | ICD-10-CM

## 2013-04-17 DIAGNOSIS — C3491 Malignant neoplasm of unspecified part of right bronchus or lung: Secondary | ICD-10-CM

## 2013-04-17 DIAGNOSIS — C3490 Malignant neoplasm of unspecified part of unspecified bronchus or lung: Secondary | ICD-10-CM

## 2013-04-17 LAB — CBC WITH DIFFERENTIAL/PLATELET
BASO%: 2.1 % — ABNORMAL HIGH (ref 0.0–2.0)
Basophils Absolute: 0 10e3/uL (ref 0.0–0.1)
EOS%: 1.8 % (ref 0.0–7.0)
Eosinophils Absolute: 0 10e3/uL (ref 0.0–0.5)
HCT: 28.4 % — ABNORMAL LOW (ref 34.8–46.6)
HGB: 9.9 g/dL — ABNORMAL LOW (ref 11.6–15.9)
LYMPH%: 22.7 % (ref 14.0–49.7)
MCH: 30 pg (ref 25.1–34.0)
MCHC: 34.7 g/dL (ref 31.5–36.0)
MCV: 86.6 fL (ref 79.5–101.0)
MONO#: 0 10e3/uL — ABNORMAL LOW (ref 0.1–0.9)
MONO%: 2.9 % (ref 0.0–14.0)
NEUT#: 1.2 10e3/uL — ABNORMAL LOW (ref 1.5–6.5)
NEUT%: 70.5 % (ref 38.4–76.8)
Platelets: 106 10e3/uL — ABNORMAL LOW (ref 145–400)
RBC: 3.28 10e6/uL — ABNORMAL LOW (ref 3.70–5.45)
RDW: 16.3 % — ABNORMAL HIGH (ref 11.2–14.5)
WBC: 1.6 10e3/uL — ABNORMAL LOW (ref 3.9–10.3)
lymph#: 0.4 10e3/uL — ABNORMAL LOW (ref 0.9–3.3)

## 2013-04-17 LAB — COMPREHENSIVE METABOLIC PANEL (CC13)
AST: 10 U/L (ref 5–34)
Albumin: 2.5 g/dL — ABNORMAL LOW (ref 3.5–5.0)
BUN: 13.9 mg/dL (ref 7.0–26.0)
Calcium: 9.1 mg/dL (ref 8.4–10.4)
Chloride: 96 mEq/L — ABNORMAL LOW (ref 98–109)
Glucose: 110 mg/dl (ref 70–140)
Potassium: 4 mEq/L (ref 3.5–5.1)
Total Protein: 6.4 g/dL (ref 6.4–8.3)

## 2013-04-18 NOTE — Addendum Note (Signed)
Encounter addended by: Lonie Peak, MD on: 04/18/2013 10:44 AM<BR>     Documentation filed: Notes Section

## 2013-04-18 NOTE — Progress Notes (Signed)
Simulation Verification Note  03-08-13 OUTPATIENT  The patient was brought to the treatment unit and placed in the planned treatment position. The clinical setup was verified. Then port films were obtained and uploaded to the radiation oncology medical record software.  The treatment beams were carefully compared against the planned radiation fields. The position location and shape of the radiation fields was reviewed. They targeted volume of tissue appears to be appropriately covered by the radiation beams. Organs at risk appear to be excluded as planned.  Her simulation verification was approved. The patient's treatment will proceed as planned.  -----------------------------------  Lonie Peak, MD

## 2013-04-24 ENCOUNTER — Other Ambulatory Visit (HOSPITAL_BASED_OUTPATIENT_CLINIC_OR_DEPARTMENT_OTHER): Payer: BC Managed Care – PPO | Admitting: Lab

## 2013-04-24 ENCOUNTER — Ambulatory Visit: Payer: BC Managed Care – PPO

## 2013-04-24 DIAGNOSIS — C3491 Malignant neoplasm of unspecified part of right bronchus or lung: Secondary | ICD-10-CM

## 2013-04-24 DIAGNOSIS — C3490 Malignant neoplasm of unspecified part of unspecified bronchus or lung: Secondary | ICD-10-CM

## 2013-04-24 DIAGNOSIS — C342 Malignant neoplasm of middle lobe, bronchus or lung: Secondary | ICD-10-CM

## 2013-04-24 LAB — CBC WITH DIFFERENTIAL/PLATELET
Basophils Absolute: 0 10*3/uL (ref 0.0–0.1)
EOS%: 0 % (ref 0.0–7.0)
HGB: 8.6 g/dL — ABNORMAL LOW (ref 11.6–15.9)
MCH: 29.9 pg (ref 25.1–34.0)
NEUT#: 13.6 10*3/uL — ABNORMAL HIGH (ref 1.5–6.5)
RBC: 2.88 10*6/uL — ABNORMAL LOW (ref 3.70–5.45)
RDW: 17.4 % — ABNORMAL HIGH (ref 11.2–14.5)
lymph#: 0.6 10*3/uL — ABNORMAL LOW (ref 0.9–3.3)

## 2013-04-24 LAB — COMPREHENSIVE METABOLIC PANEL (CC13)
ALT: 10 U/L (ref 0–55)
Albumin: 2.2 g/dL — ABNORMAL LOW (ref 3.5–5.0)
CO2: 26 mEq/L (ref 22–29)
Chloride: 96 mEq/L — ABNORMAL LOW (ref 98–109)
Glucose: 143 mg/dl — ABNORMAL HIGH (ref 70–140)
Potassium: 3.3 mEq/L — ABNORMAL LOW (ref 3.5–5.1)
Sodium: 133 mEq/L — ABNORMAL LOW (ref 136–145)
Total Bilirubin: <0.2 mg/dL (ref 0.20–1.20)
Total Protein: 6.3 g/dL — ABNORMAL LOW (ref 6.4–8.3)

## 2013-04-24 NOTE — Progress Notes (Signed)
Quick Note:  Call patient with the result and order k rich diet ______

## 2013-04-25 ENCOUNTER — Ambulatory Visit: Payer: BC Managed Care – PPO

## 2013-04-25 ENCOUNTER — Telehealth: Payer: Self-pay | Admitting: *Deleted

## 2013-04-25 NOTE — Telephone Encounter (Signed)
Message copied by Caren Griffins on Wed Apr 25, 2013 11:50 AM ------      Message from: Si Gaul      Created: Tue Apr 24, 2013 10:57 PM       Call patient with the result and order k rich diet ------

## 2013-04-25 NOTE — Telephone Encounter (Signed)
Called and spoke with pt's daughter, she verbalized understanding regarding increasing k rich foods in diet.  SLJ

## 2013-04-26 ENCOUNTER — Ambulatory Visit: Payer: BC Managed Care – PPO

## 2013-04-27 ENCOUNTER — Ambulatory Visit
Admission: RE | Admit: 2013-04-27 | Discharge: 2013-04-27 | Disposition: A | Discharge status: Home / Self Care | Payer: BC Managed Care – PPO | Source: Ambulatory Visit | Attending: Radiation Oncology | Admitting: Radiation Oncology

## 2013-04-27 ENCOUNTER — Encounter: Payer: Self-pay | Admitting: Oncology

## 2013-04-27 VITALS — BP 123/82 | HR 120 | Temp 97.5°F | Ht 63.0 in | Wt 89.1 lb

## 2013-04-27 DIAGNOSIS — R21 Rash and other nonspecific skin eruption: Secondary | ICD-10-CM

## 2013-04-27 DIAGNOSIS — C342 Malignant neoplasm of middle lobe, bronchus or lung: Secondary | ICD-10-CM

## 2013-04-27 HISTORY — DX: Personal history of irradiation: Z92.3

## 2013-04-27 MED ORDER — VALACYCLOVIR HCL 1 G PO TABS: 1000.0000 mg | ORAL_TABLET | Freq: Two times a day (BID) | ORAL | Status: AC

## 2013-04-27 NOTE — Progress Notes (Signed)
Radiation Oncology         [phone] ________________________________  Name: Kristina Fischer MRN: [id number]  Date: 04/27/2013  DOB: 09-Nov-1950  Follow-Up Visit Note  Outpatient  CC: No PCP Per Patient  No ref. provider found  Diagnosis and Prior Radiotherapy:   Neuroendocrine Carcinoma of the Right Lung, Small Cell Type   Indication for treatment: Palliative  Radiation treatment dates: 03/08/2013-03/28/2013  Site/dose:  1) Right hilum, subcarina, RML, RLL / 30 Gy in 10 fractions  2) L5-S4 / 30 Gy in 10 fractions  Narrative:  The patient returns today for routine follow-up.                            She reports poor appetite. She is taking Remeron. Weight is down to 89 pounds. She continues on chemotherapy. She reports that she's been getting sores on her skin particularly in the genitalia. They're pustular and then scabbed over. She has had them on her chest and her head in her umbilical region as well. She denies shortness of breath. She has a cough with clear sputum. She denies headaches or new neurologic deficits. She denies any pain currently.  ALLERGIES:  has No Known Allergies.  Meds: Current Outpatient Prescriptions  Medication Sig Dispense Refill  . lidocaine-prilocaine (EMLA) cream Apply topically as needed.  30 g  0  . mirtazapine (REMERON) 30 MG tablet Take 1 tablet (30 mg total) by mouth at bedtime.  30 tablet  2  . oxyCODONE (OXYCONTIN) 10 MG 12 hr tablet Take 1 tablet (10 mg total) by mouth every 12 (twelve) hours.  60 tablet  0  . OVER THE COUNTER MEDICATION Take 2 capsules by mouth daily. Natural Supplement from health food store for arthritis.      Marland Kitchen oxyCODONE (OXY IR/ROXICODONE) 5 MG immediate release tablet Take 1 tablet (5 mg total) by mouth every 3 (three) hours as needed for pain.  90 tablet  0  . prochlorperazine (COMPAZINE) 10 MG tablet Take 1 tablet (10 mg total) by mouth every 6 (six) hours as needed.  60 tablet  0  . senna (SENOKOT) 8.6 MG tablet Take  1 tablet by mouth daily as needed for constipation.      . valACYclovir (VALTREX) 1000 MG tablet Take 1 tablet (1,000 mg total) by mouth 2 (two) times daily.  20 tablet  0   No current facility-administered medications for this encounter.    Physical Findings: The patient is in no acute distress. Patient is alert and oriented.  height is 5\' 3"  (1.6 m) and weight is 89 lb 1.6 oz (40.415 kg). Her temperature is 97.5 F (36.4 C). Her blood pressure is 123/82 and her pulse is 120. Her oxygen saturation is 97%. .  Cranial nerves are grossly intact. Chest is notable for atelectasis in right lower lung. Otherwise clear. She has erythematous raised lesions some of which are pustular / some of which are scabbed over. Most prominent in the genitalia. Also noted in the periUmbilical and chest regions  Lab Findings: Lab Results  Component Value Date   WBC 15.3* 04/24/2013   HGB 8.6* 04/24/2013   HCT 25.0* 04/24/2013   MCV 86.7 04/24/2013   PLT 63 confirmed both analyzers* 04/24/2013    Radiographic Findings: No results found.  Impression/Plan:   This is a lovely patient with neuroendocrine lung cancer, stage IV, consistent with small cell lung cancer. 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 The decision was made with Dr. Arbutus Ped to proceed with chemotherapy indefinitely and consider treating the brain later as she is asymptomatic. She does not report any progressive symptoms today.  I will follow her brain with a repeat MRI in several weeks in followup thereafter.  As for her rashI  am not sure of the etiology but it could be herpes, and therefore I prescribed Valtrex today. If this is not better by her appointment with medical oncology she can address with Dr. Shirline Frees.   I spent 20 minutes face to face with the patient and more than 50% of that time was spent in counseling and/or coordination of care. _____________________________________   Lonie Peak, MD

## 2013-04-27 NOTE — Progress Notes (Addendum)
Kristina Fischer here for follow up after treatment to her right lung and l5-s4 spine.  She denies pain but reports she takes pain pills for her legs.  She does have a cough and is bringing up clear sputum. She denies shortness of breath.  She denies nausea.  She reports a poor appetite and has is down to 89.1 lb.  She is due to have chemotherapy next week.  She states that she has been getting sores that are itchy and scab over.  She has one on the right side of her head above her ear and one in her ambilical region.  She also reports them in her "private parts."  She is smoking 3/4 of a pack in 24 hours.

## 2013-05-01 ENCOUNTER — Telehealth: Payer: Self-pay | Admitting: Internal Medicine

## 2013-05-01 ENCOUNTER — Ambulatory Visit (HOSPITAL_BASED_OUTPATIENT_CLINIC_OR_DEPARTMENT_OTHER): Payer: BC Managed Care – PPO

## 2013-05-01 ENCOUNTER — Ambulatory Visit (HOSPITAL_COMMUNITY)
Admission: RE | Admit: 2013-05-01 | Discharge: 2013-05-01 | Disposition: A | Discharge status: Home / Self Care | Payer: BC Managed Care – PPO | Source: Ambulatory Visit | Attending: Internal Medicine | Admitting: Internal Medicine

## 2013-05-01 ENCOUNTER — Ambulatory Visit (HOSPITAL_BASED_OUTPATIENT_CLINIC_OR_DEPARTMENT_OTHER): Payer: BC Managed Care – PPO | Admitting: Internal Medicine

## 2013-05-01 ENCOUNTER — Other Ambulatory Visit (HOSPITAL_BASED_OUTPATIENT_CLINIC_OR_DEPARTMENT_OTHER): Payer: BC Managed Care – PPO | Admitting: Lab

## 2013-05-01 ENCOUNTER — Ambulatory Visit: Payer: BC Managed Care – PPO | Admitting: Nutrition

## 2013-05-01 ENCOUNTER — Encounter: Payer: Self-pay | Admitting: Internal Medicine

## 2013-05-01 ENCOUNTER — Encounter (HOSPITAL_COMMUNITY): Payer: Self-pay

## 2013-05-01 VITALS — BP 118/69 | HR 117 | Temp 97.4°F | Resp 20 | Ht 63.0 in | Wt 90.7 lb

## 2013-05-01 DIAGNOSIS — C7B8 Other secondary neuroendocrine tumors: Secondary | ICD-10-CM

## 2013-05-01 DIAGNOSIS — C787 Secondary malignant neoplasm of liver and intrahepatic bile duct: Secondary | ICD-10-CM

## 2013-05-01 DIAGNOSIS — R609 Edema, unspecified: Secondary | ICD-10-CM

## 2013-05-01 DIAGNOSIS — Z716 Tobacco abuse counseling: Secondary | ICD-10-CM | POA: Insufficient documentation

## 2013-05-01 DIAGNOSIS — J438 Other emphysema: Secondary | ICD-10-CM | POA: Insufficient documentation

## 2013-05-01 DIAGNOSIS — C3491 Malignant neoplasm of unspecified part of right bronchus or lung: Secondary | ICD-10-CM

## 2013-05-01 DIAGNOSIS — E279 Disorder of adrenal gland, unspecified: Secondary | ICD-10-CM | POA: Insufficient documentation

## 2013-05-01 DIAGNOSIS — C3490 Malignant neoplasm of unspecified part of unspecified bronchus or lung: Secondary | ICD-10-CM

## 2013-05-01 DIAGNOSIS — C7951 Secondary malignant neoplasm of bone: Secondary | ICD-10-CM | POA: Insufficient documentation

## 2013-05-01 DIAGNOSIS — Z5111 Encounter for antineoplastic chemotherapy: Secondary | ICD-10-CM

## 2013-05-01 DIAGNOSIS — C7A090 Malignant carcinoid tumor of the bronchus and lung: Secondary | ICD-10-CM

## 2013-05-01 DIAGNOSIS — I2510 Atherosclerotic heart disease of native coronary artery without angina pectoris: Secondary | ICD-10-CM | POA: Insufficient documentation

## 2013-05-01 DIAGNOSIS — Z923 Personal history of irradiation: Secondary | ICD-10-CM | POA: Insufficient documentation

## 2013-05-01 DIAGNOSIS — J479 Bronchiectasis, uncomplicated: Secondary | ICD-10-CM | POA: Insufficient documentation

## 2013-05-01 DIAGNOSIS — Z79899 Other long term (current) drug therapy: Secondary | ICD-10-CM | POA: Insufficient documentation

## 2013-05-01 DIAGNOSIS — F17200 Nicotine dependence, unspecified, uncomplicated: Secondary | ICD-10-CM

## 2013-05-01 LAB — COMPREHENSIVE METABOLIC PANEL (CC13)
ALT: 7 U/L (ref 0–55)
Albumin: 2 g/dL — ABNORMAL LOW (ref 3.5–5.0)
Alkaline Phosphatase: 159 U/L — ABNORMAL HIGH (ref 40–150)
Potassium: 3.6 mEq/L (ref 3.5–5.1)
Sodium: 138 mEq/L (ref 136–145)
Total Bilirubin: 0.21 mg/dL (ref 0.20–1.20)
Total Protein: 6.2 g/dL — ABNORMAL LOW (ref 6.4–8.3)

## 2013-05-01 LAB — CBC WITH DIFFERENTIAL/PLATELET
BASO%: 0.4 % (ref 0.0–2.0)
LYMPH%: 4.3 % — ABNORMAL LOW (ref 14.0–49.7)
MCHC: 34.7 g/dL (ref 31.5–36.0)
MONO#: 0.9 10*3/uL (ref 0.1–0.9)
MONO%: 6.6 % (ref 0.0–14.0)
Platelets: 336 10*3/uL (ref 145–400)
RBC: 2.86 10*6/uL — ABNORMAL LOW (ref 3.70–5.45)
RDW: 18.9 % — ABNORMAL HIGH (ref 11.2–14.5)
WBC: 13.4 10*3/uL — ABNORMAL HIGH (ref 3.9–10.3)

## 2013-05-01 MED ORDER — IOHEXOL 300 MG/ML  SOLN
80.0000 mL | Freq: Once | INTRAMUSCULAR | Status: AC | PRN
  Administered 2013-05-01: 80 mL via INTRAVENOUS

## 2013-05-01 MED ORDER — SODIUM CHLORIDE 0.9 % IV SOLN
Freq: Once | INTRAVENOUS | Status: AC
  Administered 2013-05-01: 14:00:00 via INTRAVENOUS

## 2013-05-01 MED ORDER — DEXAMETHASONE SODIUM PHOSPHATE 20 MG/5ML IJ SOLN
20.0000 mg | Freq: Once | INTRAMUSCULAR | Status: AC
  Administered 2013-05-01: 20 mg via INTRAVENOUS

## 2013-05-01 MED ORDER — SODIUM CHLORIDE 0.9 % IV SOLN
100.0000 mg/m2 | Freq: Once | INTRAVENOUS | Status: AC
  Administered 2013-05-01: 140 mg via INTRAVENOUS
  Filled 2013-05-01: qty 7

## 2013-05-01 MED ORDER — FUROSEMIDE 20 MG PO TABS: 20.0000 mg | ORAL_TABLET | Freq: Every day | ORAL | Status: AC

## 2013-05-01 MED ORDER — SODIUM CHLORIDE 0.9 % IV SOLN
480.0000 mg | Freq: Once | INTRAVENOUS | Status: AC
  Administered 2013-05-01: 480 mg via INTRAVENOUS
  Filled 2013-05-01: qty 48

## 2013-05-01 MED ORDER — HEPARIN SOD (PORK) LOCK FLUSH 100 UNIT/ML IV SOLN
500.0000 [IU] | Freq: Once | INTRAVENOUS | Status: AC | PRN
  Administered 2013-05-01: 500 [IU]
  Filled 2013-05-01: qty 5

## 2013-05-01 MED ORDER — ONDANSETRON 16 MG/50ML IVPB (CHCC)
16.0000 mg | Freq: Once | INTRAVENOUS | Status: AC
  Administered 2013-05-01: 16 mg via INTRAVENOUS

## 2013-05-01 MED ORDER — SODIUM CHLORIDE 0.9 % IJ SOLN
10.0000 mL | INTRAMUSCULAR | Status: DC | PRN
  Administered 2013-05-01: 10 mL
  Filled 2013-05-01: qty 10

## 2013-05-01 NOTE — Progress Notes (Signed)
Patient's weight was documented as 90.7 pounds on August 5.  This is a BMI of 16.07, which is still underweight.  Her weight has increased slightly from 86.5 pounds in mid July.  Patient continues to report she tries to eat and drink when she feels well. She drinks 2 Ensure Plus daily.  She denies nutrition questions.  Nutrition diagnosis: Inadequate oral intake and unintended weight loss improved.  Intervention: Patient was educated to continue Ensure Plus and ice cream, which she enjoys.  Patient able to verbalize ways to add more calories and protein.  Teach back method used.  Monitoring, evaluation, goals: Patient will tolerate increased calories and protein to prevent further weight loss.  Next visit: Thursday, August 28, during chemotherapy.

## 2013-05-01 NOTE — Progress Notes (Signed)
Spoke to pt today regarding smoking cessation.  Gave information from QUIT NOW and [facility].

## 2013-05-01 NOTE — Telephone Encounter (Signed)
gv and printed appt sched and avs for pt...s/w and emailed MB to add tx...   °

## 2013-05-01 NOTE — Patient Instructions (Addendum)
[facility] Discharge Instructions for Patients Receiving Chemotherapy  Today you received the following chemotherapy agents Carboplatin and Etoposide.  To help prevent nausea and vomiting after your treatment, we encourage you to take your nausea medication.   If you develop nausea and vomiting that is not controlled by your nausea medication, call the clinic.   BELOW ARE SYMPTOMS THAT SHOULD BE REPORTED IMMEDIATELY:  *FEVER GREATER THAN 100.5 F  *CHILLS WITH OR WITHOUT FEVER  NAUSEA AND VOMITING THAT IS NOT CONTROLLED WITH YOUR NAUSEA MEDICATION  *UNUSUAL SHORTNESS OF BREATH  *UNUSUAL BRUISING OR BLEEDING  TENDERNESS IN MOUTH AND THROAT WITH OR WITHOUT PRESENCE OF ULCERS  *URINARY PROBLEMS  *BOWEL PROBLEMS  UNUSUAL RASH Items with * indicate a potential emergency and should be followed up as soon as possible.  Feel free to call the clinic you have any questions or concerns. The clinic phone number is [phone].    

## 2013-05-01 NOTE — Addendum Note (Signed)
Addended by: Caren Griffins on: 05/01/2013 03:44 PM   Modules accepted: Orders

## 2013-05-01 NOTE — Patient Instructions (Addendum)
CHEMOTHERAPY INTENT: Palliative  CURRENT # OF CHEMOTHERAPY CYCLES: 3  CURRENT ANTIEMETICS: Zofran, dexamethasone and Compazine  CURRENT SMOKING STATUS: Current smoker. We consulted the patient about smoke cessation and offered her smoke cessation program.  ORAL CHEMOTHERAPY AND CONSENT: None  CURRENT BISPHOSPHONATES USE: None  PAIN MANAGEMENT: 2/10. Currently on OxyContin  NARCOTICS INDUCED CONSTIPATION: No constipation and uses over-the-counter stool softener.  LIVING WILL AND CODE STATUS: No CODE BLUE

## 2013-05-01 NOTE — Progress Notes (Signed)
[facility] [phone]   [phone]  OFFICE PROGRESS NOTE  No PCP Per Patient [address] [place] [address]  DIAGNOSIS AND STAGE: Extensive stage small cell lung cancer with bulky mediastinal adenopathy in addition to liver, adrenal, porta hepatis, brain as well as large sacral metastasis diagnosed in June of 2014   PRIOR THERAPY: Palliative radiotherapy to the sacral mass under the care of Dr. Basilio Cairo   CURRENT THERAPY: Systemic chemotherapy with carboplatin for AUC of 5 on day 1 and etoposide 120 mg/M2 on days 1, 2 and 3 with Neulasta support on day 4, status post 2 cycles. First dose was given on 03/13/2013.  CHEMOTHERAPY INTENT: Palliative  CURRENT # OF CHEMOTHERAPY CYCLES: 3  CURRENT ANTIEMETICS: Zofran, dexamethasone and Compazine  CURRENT SMOKING STATUS: Current smoker. We consulted the patient about smoke cessation and offered her smoke cessation program.  ORAL CHEMOTHERAPY AND CONSENT: None  CURRENT BISPHOSPHONATES USE: None  PAIN MANAGEMENT: 2/10. Currently on OxyContin  NARCOTICS INDUCED CONSTIPATION: No constipation and uses over-the-counter stool softener.  LIVING WILL AND CODE STATUS: No CODE BLUE   INTERVAL HISTORY: Kristina Fischer 62 y.o. female returns to the clinic today for followup visit. The patient is feeling fine today with no specific complaints except for mild fatigue and swelling of the lower extremities. The patient denied having any significant chest pain but continues to have shortness breath with exertion with no cough or hemoptysis. She denied having any significant weight loss since her last visit. The patient denied having any fever or chills. She has no nausea or vomiting. She tolerated the last cycle of her systemic chemotherapy fairly well with no significant adverse effect except for the fatigue. She had repeat CT scan of the chest, abdomen and pelvis performed earlier today and she is here for  evaluation and discussion of her scan results.  MEDICAL HISTORY: Past Medical History  Diagnosis Date  . Arthritis   . Sciatica   . COPD (chronic obstructive pulmonary disease)   . Sacral mass   . Cancer   . Anxiety   . Shortness of breath   . 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 Rheumatoid arthritis   . Difficulty swallowing   . History of radiation therapy 03/08/2013-03/28/2013    30 Gu to right hilum, 30 Gy to L5-S4    ALLERGIES:  has No Known Allergies.  MEDICATIONS:  Current Outpatient Prescriptions  Medication Sig Dispense Refill  . lidocaine-prilocaine (EMLA) cream Apply topically as needed.  30 g  0  . mirtazapine (REMERON) 30 MG tablet Take 1 tablet (30 mg total) by mouth at bedtime.  30 tablet  2  . OVER THE COUNTER MEDICATION Take 2 capsules by mouth daily. Natural Supplement from health food store for arthritis.      Marland Kitchen oxyCODONE (OXY IR/ROXICODONE) 5 MG immediate release tablet Take 1 tablet (5 mg total) by mouth every 3 (three) hours as needed for pain.  90 tablet  0  . oxyCODONE (OXYCONTIN) 10 MG 12 hr tablet Take 1 tablet (10 mg total) by mouth every 12 (twelve) hours.  60 tablet  0  . prochlorperazine (COMPAZINE) 10 MG tablet Take 1 tablet (10 mg total) by mouth every 6 (six) hours as needed.  60 tablet  0  . senna (SENOKOT) 8.6 MG tablet Take 1 tablet by mouth daily as needed for constipation.      Marland Kitchen  valACYclovir (VALTREX) 1000 MG tablet Take 1 tablet (1,000 mg total) by mouth 2 (two) times daily.  20 tablet  0   No current facility-administered medications for this visit.    SURGICAL HISTORY:  Past Surgical History  Procedure Laterality Date  . Abdominal hysterectomy    . Rhinoplasty      REVIEW OF SYSTEMS:  A comprehensive review of systems was negative except for: Constitutional: positive for anorexia and fatigue Respiratory: positive for dyspnea on exertion Swelling of the lower extremities    PHYSICAL EXAMINATION: General appearance: alert, cooperative, fatigued and no distress Head: Normocephalic, without obvious abnormality, atraumatic Neck: no adenopathy Lymph nodes: Cervical, supraclavicular, and axillary nodes normal. Resp: clear to auscultation bilaterally Cardio: regular rate and rhythm, S1, S2 normal, no murmur, click, rub or gallop GI: soft, non-tender; bowel sounds normal; no masses,  no organomegaly Extremities: edema 2+ Neurologic: Alert and oriented X 3, normal strength and tone. Normal symmetric reflexes. Normal coordination and gait  ECOG PERFORMANCE STATUS: 1 - Symptomatic but completely ambulatory  Blood pressure 118/69, pulse 117, temperature 97.4 F (36.3 C), temperature source Oral, resp. rate 20, height 5\' 3"  (1.6 m), weight 90 lb 11.2 oz (41.141 kg).  LABORATORY DATA: Lab Results  Component Value Date   WBC 13.4* 05/01/2013   HGB 8.7* 05/01/2013   HCT 25.0* 05/01/2013   MCV 87.4 05/01/2013   PLT 336 05/01/2013      Chemistry      Component Value Date/Time   NA 138 05/01/2013 1157   NA 139 02/26/2013 0927   K 3.6 05/01/2013 1157   K 3.9 02/26/2013 0927   CL 96* 03/20/2013 1436   CL 98 02/26/2013 0927   CO2 27 05/01/2013 1157   CO2 30 02/26/2013 0927   BUN 5.2* 05/01/2013 1157   BUN 9 02/26/2013 0927   CREATININE 0.6 05/01/2013 1157   CREATININE 0.73 02/26/2013 0927      Component Value Date/Time   CALCIUM 8.9 05/01/2013 1157   CALCIUM 9.2 02/26/2013 0927   ALKPHOS 159* 05/01/2013 1157   ALKPHOS 105 02/26/2013 0927   AST 10 05/01/2013 1157   AST 27 02/26/2013 0927   ALT 7 05/01/2013 1157   ALT 18 02/26/2013 0927   BILITOT 0.21 05/01/2013 1157   BILITOT 0.2* 02/26/2013 0927       RADIOGRAPHIC STUDIES: No results found.  ASSESSMENT AND PLAN: This is a very pleasant 62 years old white female with extensive stage small cell lung cancer currently undergoing systemic chemotherapy with carboplatin and etoposide status post 2 cycles. The patient had significant improvement in her  disease based on the recent scan. The final report is still pending but comparing her images to the previous scans there is significant improvement in the right lung masses as well as lymphadenopathy. I discussed the scan results and showed the images to the patient. I recommended for her to continue on systemic chemotherapy with the same regimen. She will start cycle #3 today. She would come back for followup visit in 3 weeks with the start of cycle #4. For the lower extremity edema, I will start the patient on Lasix 20 mg by mouth daily for the next 7 days. She was advised to increase her potassium rich diet. The patient was advised to call immediately if she has any concerning symptoms in the interval. For smoke cessation, I strongly advise the patient to quit smoking and she was seen by the thoracic navigator for further discussion and counseling about smoke  cessation. The patient voices understanding of current disease status and treatment options and is in agreement with the current care plan.  All questions were answered. The patient knows to call the clinic with any problems, questions or concerns. We can certainly see the patient much sooner if necessary.  I spent 15 minutes counseling the patient face to face. The total time spent in the appointment was 25 minutes.

## 2013-05-02 ENCOUNTER — Ambulatory Visit (HOSPITAL_BASED_OUTPATIENT_CLINIC_OR_DEPARTMENT_OTHER): Payer: BC Managed Care – PPO

## 2013-05-02 VITALS — BP 128/75 | HR 98 | Temp 98.0°F

## 2013-05-02 DIAGNOSIS — C3490 Malignant neoplasm of unspecified part of unspecified bronchus or lung: Secondary | ICD-10-CM

## 2013-05-02 DIAGNOSIS — C787 Secondary malignant neoplasm of liver and intrahepatic bile duct: Secondary | ICD-10-CM

## 2013-05-02 DIAGNOSIS — C7970 Secondary malignant neoplasm of unspecified adrenal gland: Secondary | ICD-10-CM

## 2013-05-02 DIAGNOSIS — Z5111 Encounter for antineoplastic chemotherapy: Secondary | ICD-10-CM

## 2013-05-02 MED ORDER — ONDANSETRON 8 MG/50ML IVPB (CHCC)
8.0000 mg | Freq: Once | INTRAVENOUS | Status: AC
  Administered 2013-05-02: 8 mg via INTRAVENOUS

## 2013-05-02 MED ORDER — SODIUM CHLORIDE 0.9 % IV SOLN
100.0000 mg/m2 | Freq: Once | INTRAVENOUS | Status: AC
  Administered 2013-05-02: 140 mg via INTRAVENOUS
  Filled 2013-05-02: qty 7

## 2013-05-02 MED ORDER — SODIUM CHLORIDE 0.9 % IJ SOLN
10.0000 mL | INTRAMUSCULAR | Status: DC | PRN
  Administered 2013-05-02: 10 mL
  Filled 2013-05-02: qty 10

## 2013-05-02 MED ORDER — HEPARIN SOD (PORK) LOCK FLUSH 100 UNIT/ML IV SOLN
500.0000 [IU] | Freq: Once | INTRAVENOUS | Status: AC | PRN
  Administered 2013-05-02: 500 [IU]
  Filled 2013-05-02: qty 5

## 2013-05-02 MED ORDER — SODIUM CHLORIDE 0.9 % IV SOLN
Freq: Once | INTRAVENOUS | Status: AC
  Administered 2013-05-02: 14:00:00 via INTRAVENOUS

## 2013-05-02 MED ORDER — DEXAMETHASONE SODIUM PHOSPHATE 10 MG/ML IJ SOLN
10.0000 mg | Freq: Once | INTRAMUSCULAR | Status: AC
  Administered 2013-05-02: 10 mg via INTRAVENOUS

## 2013-05-02 NOTE — Patient Instructions (Addendum)
[facility] Discharge Instructions for Patients Receiving Chemotherapy  Today you received the following chemotherapy agents: Etoposide   To help prevent nausea and vomiting after your treatment, we encourage you to take your nausea medication as directed by your doctor.   If you develop nausea and vomiting that is not controlled by your nausea medication, call the clinic.   BELOW ARE SYMPTOMS THAT SHOULD BE REPORTED IMMEDIATELY:  *FEVER GREATER THAN 100.5 F  *CHILLS WITH OR WITHOUT FEVER  NAUSEA AND VOMITING THAT IS NOT CONTROLLED WITH YOUR NAUSEA MEDICATION  *UNUSUAL SHORTNESS OF BREATH  *UNUSUAL BRUISING OR BLEEDING  TENDERNESS IN MOUTH AND THROAT WITH OR WITHOUT PRESENCE OF ULCERS  *URINARY PROBLEMS  *BOWEL PROBLEMS  UNUSUAL RASH Items with * indicate a potential emergency and should be followed up as soon as possible.  Feel free to call the clinic you have any questions or concerns. The clinic phone number is [phone].    

## 2013-05-03 ENCOUNTER — Ambulatory Visit (HOSPITAL_BASED_OUTPATIENT_CLINIC_OR_DEPARTMENT_OTHER): Payer: BC Managed Care – PPO

## 2013-05-03 VITALS — BP 132/86 | HR 99 | Temp 98.6°F

## 2013-05-03 DIAGNOSIS — C787 Secondary malignant neoplasm of liver and intrahepatic bile duct: Secondary | ICD-10-CM

## 2013-05-03 DIAGNOSIS — C7970 Secondary malignant neoplasm of unspecified adrenal gland: Secondary | ICD-10-CM

## 2013-05-03 DIAGNOSIS — Z5111 Encounter for antineoplastic chemotherapy: Secondary | ICD-10-CM

## 2013-05-03 DIAGNOSIS — C3490 Malignant neoplasm of unspecified part of unspecified bronchus or lung: Secondary | ICD-10-CM

## 2013-05-03 MED ORDER — ONDANSETRON 8 MG/50ML IVPB (CHCC)
8.0000 mg | Freq: Once | INTRAVENOUS | Status: AC
  Administered 2013-05-03: 8 mg via INTRAVENOUS

## 2013-05-03 MED ORDER — DEXAMETHASONE SODIUM PHOSPHATE 10 MG/ML IJ SOLN
10.0000 mg | Freq: Once | INTRAMUSCULAR | Status: AC
  Administered 2013-05-03: 10 mg via INTRAVENOUS

## 2013-05-03 MED ORDER — HEPARIN SOD (PORK) LOCK FLUSH 100 UNIT/ML IV SOLN
500.0000 [IU] | Freq: Once | INTRAVENOUS | Status: AC | PRN
  Administered 2013-05-03: 500 [IU]
  Filled 2013-05-03: qty 5

## 2013-05-03 MED ORDER — SODIUM CHLORIDE 0.9 % IV SOLN
Freq: Once | INTRAVENOUS | Status: AC
  Administered 2013-05-03: 13:00:00 via INTRAVENOUS

## 2013-05-03 MED ORDER — SODIUM CHLORIDE 0.9 % IJ SOLN
10.0000 mL | INTRAMUSCULAR | Status: DC | PRN
  Administered 2013-05-03: 10 mL
  Filled 2013-05-03: qty 10

## 2013-05-03 MED ORDER — ETOPOSIDE CHEMO INJECTION 1 GM/50ML
100.0000 mg/m2 | Freq: Once | INTRAVENOUS | Status: AC
  Administered 2013-05-03: 140 mg via INTRAVENOUS
  Filled 2013-05-03: qty 7

## 2013-05-03 NOTE — Patient Instructions (Addendum)
[facility] Discharge Instructions for Patients Receiving Chemotherapy  Today you received the following chemotherapy agents: Etoposide.  To help prevent nausea and vomiting after your treatment, we encourage you to take your nausea medication as prescribed.   If you develop nausea and vomiting that is not controlled by your nausea medication, call the clinic.   BELOW ARE SYMPTOMS THAT SHOULD BE REPORTED IMMEDIATELY:  *FEVER GREATER THAN 100.5 F  *CHILLS WITH OR WITHOUT FEVER  NAUSEA AND VOMITING THAT IS NOT CONTROLLED WITH YOUR NAUSEA MEDICATION  *UNUSUAL SHORTNESS OF BREATH  *UNUSUAL BRUISING OR BLEEDING  TENDERNESS IN MOUTH AND THROAT WITH OR WITHOUT PRESENCE OF ULCERS  *URINARY PROBLEMS  *BOWEL PROBLEMS  UNUSUAL RASH Items with * indicate a potential emergency and should be followed up as soon as possible.  Feel free to call the clinic you have any questions or concerns. The clinic phone number is [phone].    

## 2013-05-04 ENCOUNTER — Ambulatory Visit (HOSPITAL_BASED_OUTPATIENT_CLINIC_OR_DEPARTMENT_OTHER): Payer: BC Managed Care – PPO

## 2013-05-04 ENCOUNTER — Ambulatory Visit: Payer: BC Managed Care – PPO | Admitting: Radiation Oncology

## 2013-05-04 VITALS — BP 135/81 | HR 112 | Temp 98.6°F

## 2013-05-04 DIAGNOSIS — C3490 Malignant neoplasm of unspecified part of unspecified bronchus or lung: Secondary | ICD-10-CM

## 2013-05-04 DIAGNOSIS — Z5189 Encounter for other specified aftercare: Secondary | ICD-10-CM

## 2013-05-04 DIAGNOSIS — C7B8 Other secondary neuroendocrine tumors: Secondary | ICD-10-CM

## 2013-05-04 DIAGNOSIS — C787 Secondary malignant neoplasm of liver and intrahepatic bile duct: Secondary | ICD-10-CM

## 2013-05-04 DIAGNOSIS — C7A090 Malignant carcinoid tumor of the bronchus and lung: Secondary | ICD-10-CM

## 2013-05-04 MED ORDER — PEGFILGRASTIM INJECTION 6 MG/0.6ML
6.0000 mg | Freq: Once | SUBCUTANEOUS | Status: AC
  Administered 2013-05-04: 6 mg via SUBCUTANEOUS
  Filled 2013-05-04: qty 0.6

## 2013-05-08 ENCOUNTER — Other Ambulatory Visit (HOSPITAL_BASED_OUTPATIENT_CLINIC_OR_DEPARTMENT_OTHER): Payer: BC Managed Care – PPO | Admitting: Lab

## 2013-05-08 DIAGNOSIS — C7952 Secondary malignant neoplasm of bone marrow: Secondary | ICD-10-CM

## 2013-05-08 DIAGNOSIS — C7A098 Malignant carcinoid tumors of other sites: Secondary | ICD-10-CM

## 2013-05-08 DIAGNOSIS — C3490 Malignant neoplasm of unspecified part of unspecified bronchus or lung: Secondary | ICD-10-CM

## 2013-05-08 DIAGNOSIS — C7A090 Malignant carcinoid tumor of the bronchus and lung: Secondary | ICD-10-CM

## 2013-05-08 LAB — COMPREHENSIVE METABOLIC PANEL (CC13)
ALT: 9 U/L (ref 0–55)
Albumin: 2.6 g/dL — ABNORMAL LOW (ref 3.5–5.0)
Alkaline Phosphatase: 168 U/L — ABNORMAL HIGH (ref 40–150)
Potassium: 4 mEq/L (ref 3.5–5.1)
Sodium: 136 mEq/L (ref 136–145)
Total Bilirubin: 0.4 mg/dL (ref 0.20–1.20)
Total Protein: 6.7 g/dL (ref 6.4–8.3)

## 2013-05-08 LAB — CBC WITH DIFFERENTIAL/PLATELET
Basophils Absolute: 0.1 10*3/uL (ref 0.0–0.1)
EOS%: 0.1 % (ref 0.0–7.0)
Eosinophils Absolute: 0 10*3/uL (ref 0.0–0.5)
HCT: 22.6 % — ABNORMAL LOW (ref 34.8–46.6)
HGB: 7.9 g/dL — ABNORMAL LOW (ref 11.6–15.9)
MCH: 31.1 pg (ref 25.1–34.0)
MONO#: 0.3 10*3/uL (ref 0.1–0.9)
NEUT#: 8.8 10*3/uL — ABNORMAL HIGH (ref 1.5–6.5)
RDW: 19.4 % — ABNORMAL HIGH (ref 11.2–14.5)
WBC: 9.7 10*3/uL (ref 3.9–10.3)
lymph#: 0.5 10*3/uL — ABNORMAL LOW (ref 0.9–3.3)

## 2013-05-15 ENCOUNTER — Other Ambulatory Visit: Payer: BC Managed Care – PPO

## 2013-05-15 ENCOUNTER — Other Ambulatory Visit: Payer: Self-pay | Admitting: *Deleted

## 2013-05-18 ENCOUNTER — Encounter: Payer: Self-pay | Admitting: Radiation Therapy

## 2013-05-18 ENCOUNTER — Other Ambulatory Visit: Payer: Self-pay | Admitting: Radiation Therapy

## 2013-05-18 NOTE — Progress Notes (Signed)
Pt. Expired on 06/12/13

## 2013-05-22 ENCOUNTER — Other Ambulatory Visit: Payer: BC Managed Care – PPO | Admitting: Lab

## 2013-05-22 ENCOUNTER — Ambulatory Visit: Payer: BC Managed Care – PPO

## 2013-05-22 ENCOUNTER — Ambulatory Visit: Payer: BC Managed Care – PPO | Admitting: Physician Assistant

## 2013-05-23 ENCOUNTER — Ambulatory Visit: Payer: BC Managed Care – PPO

## 2013-05-24 ENCOUNTER — Ambulatory Visit: Payer: BC Managed Care – PPO

## 2013-05-24 ENCOUNTER — Encounter: Payer: BC Managed Care – PPO | Admitting: Nutrition

## 2013-05-25 ENCOUNTER — Ambulatory Visit: Payer: BC Managed Care – PPO

## 2013-05-25 ENCOUNTER — Encounter: Payer: Self-pay | Admitting: Internal Medicine

## 2013-05-25 NOTE — Progress Notes (Signed)
Patient is deceased.I will let 1st step know and deactivate card.

## 2013-05-28 DEATH — deceased

## 2013-05-29 ENCOUNTER — Other Ambulatory Visit: Payer: BC Managed Care – PPO

## 2013-06-05 ENCOUNTER — Other Ambulatory Visit: Payer: BC Managed Care – PPO

## 2013-06-12 ENCOUNTER — Other Ambulatory Visit: Payer: BC Managed Care – PPO | Admitting: Lab

## 2013-06-15 ENCOUNTER — Ambulatory Visit: Payer: BC Managed Care – PPO

## 2014-05-21 ENCOUNTER — Other Ambulatory Visit: Payer: Self-pay | Admitting: *Deleted

## 2015-01-05 IMAGING — CT CT ABD-PELV W/ CM
2 of 5 series · 11 of 36 positions shown, 13 images · IV contrast (Omnipaque 300)
Comparison: Chest radiograph 02/26/2013 cough.

CT CHEST

CLINICAL DATA: Abnormal chest radiograph epigastric pain and
weight loss.

CT CHEST, ABDOMEN AND PELVIS WITH CONTRAST
TECHNIQUE: Multidetector CT imaging of the chest, abdomen and
pelvis was performed following the standard protocol during bolus
administration of intravenous contrast.
Contrast: 100mL OMNIPAQUE IOHEXOL 300 MG/ML  SOLN

[Series 2: cap with 5.0 b40f · axial · 0.60mm/px · z∈[-612,-72]mm · 8 of 126 slices shown, 10 images]
[im 9/126  mediastinal]
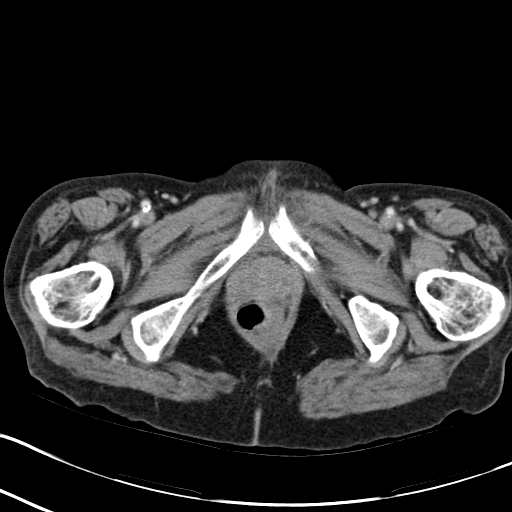
[im 9/126  lung]
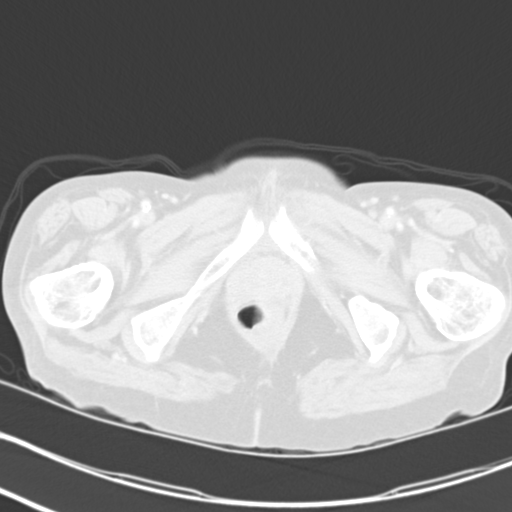
[im 27/126  lung]
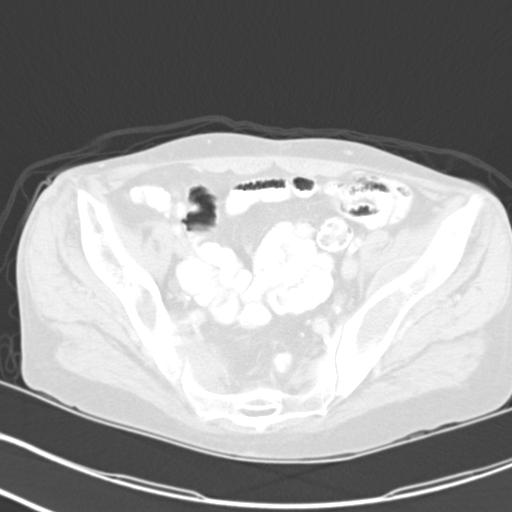
[im 45/126  lung]
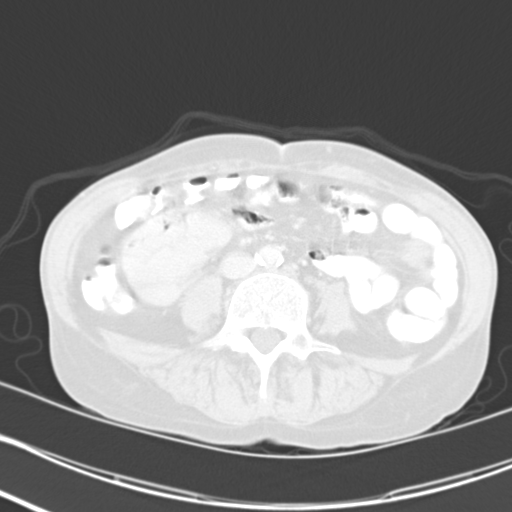
[im 54/126  lung]
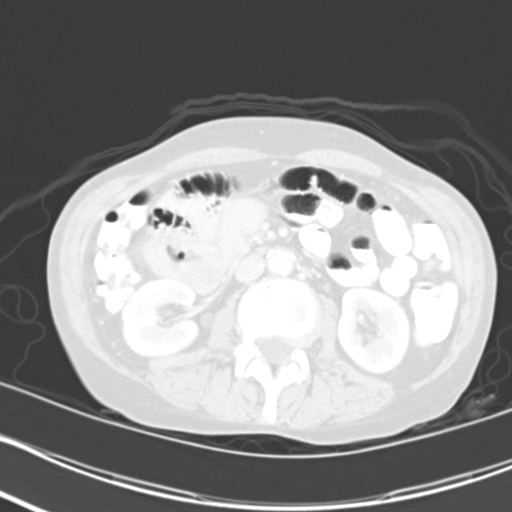
[im 72/126  mediastinal]
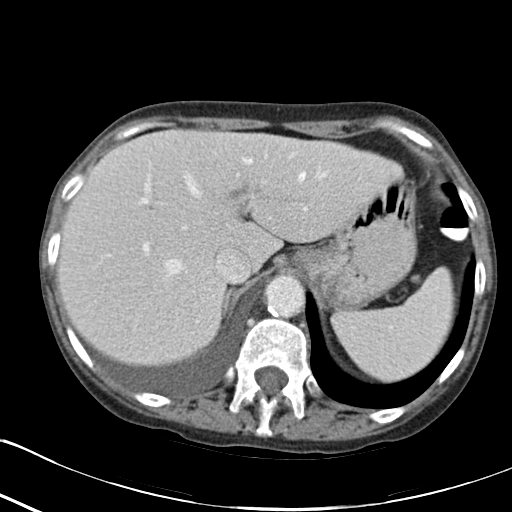
[im 72/126  lung]
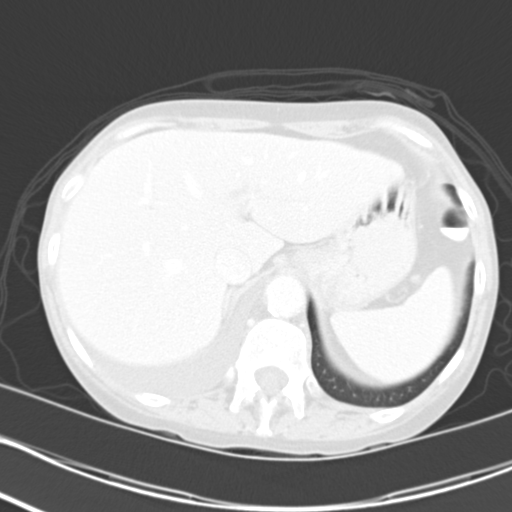
[im 81/126  lung]
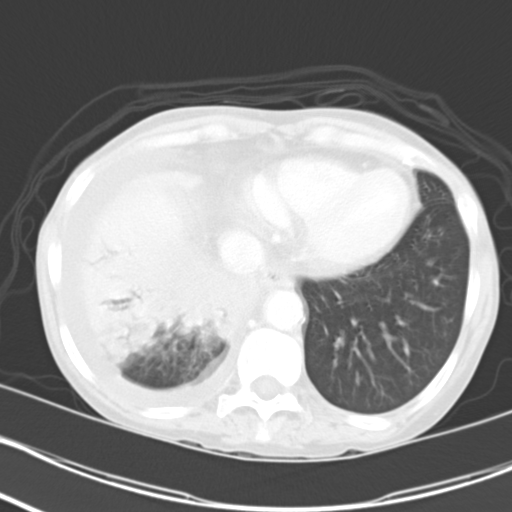
[im 99/126  lung]
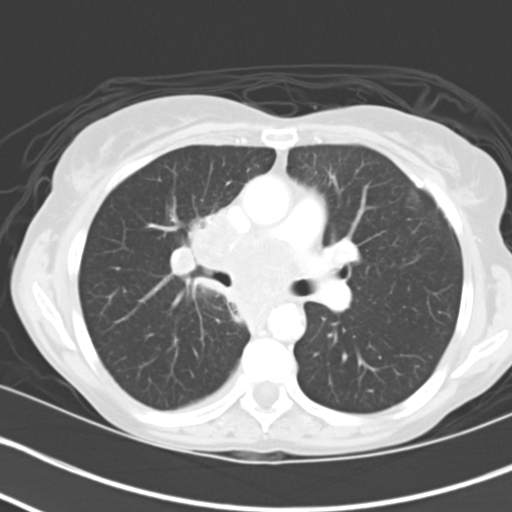
[im 117/126  lung]
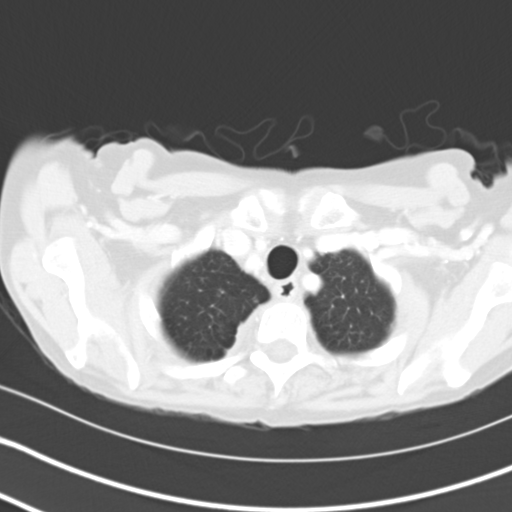

[Series 4: mpr cor post contrast (id) · coronal · 0.61mm/px · 3 of 70 slices shown]
[im 14/70  lung]
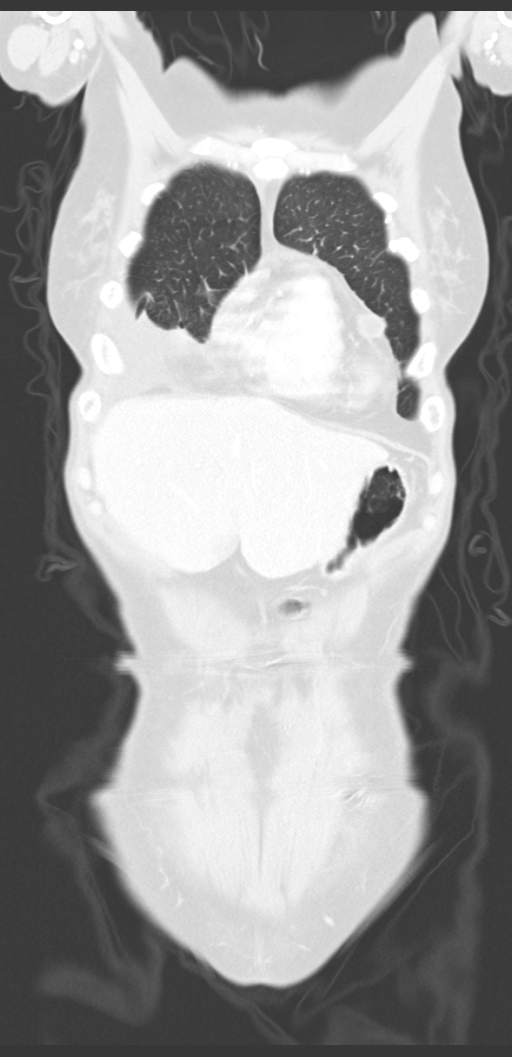
[im 28/70  lung]
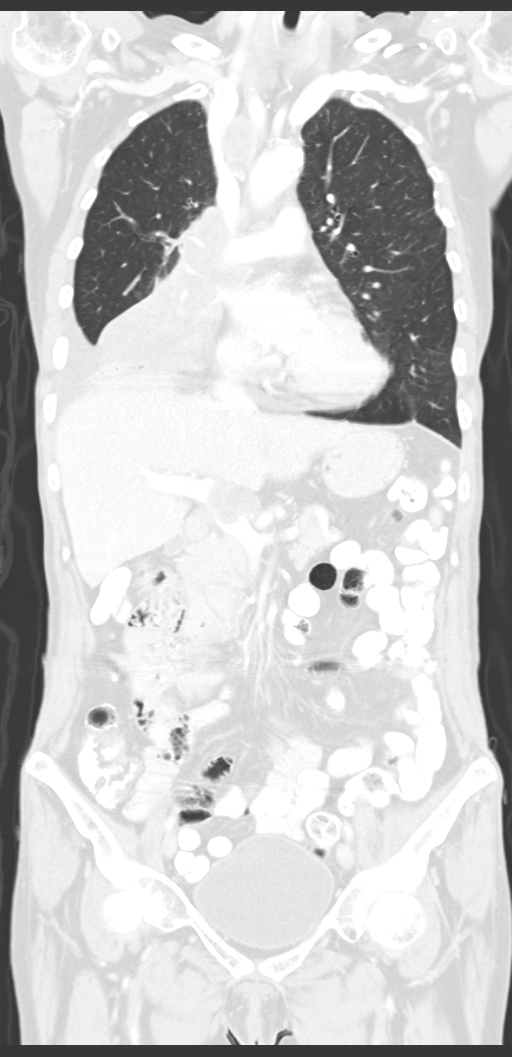
[im 42/70  lung]
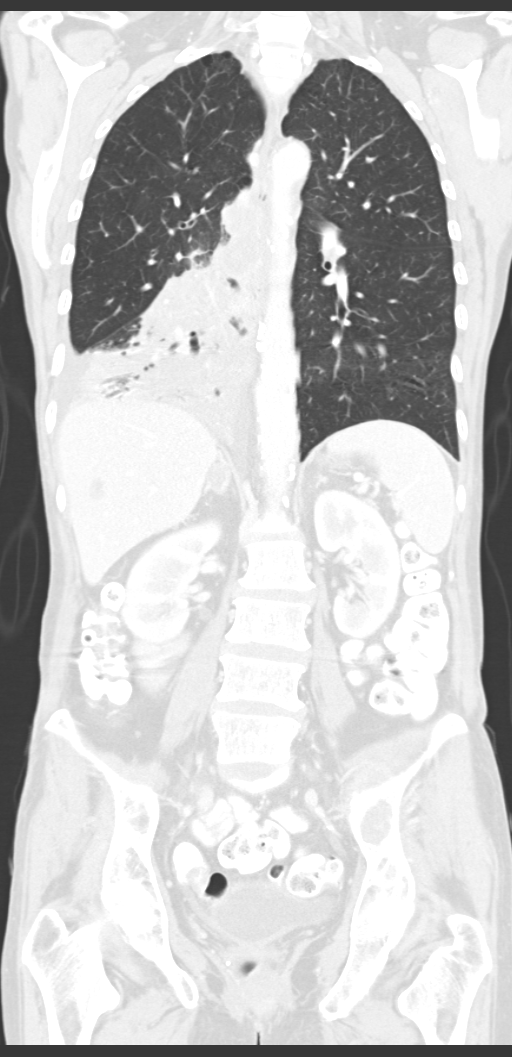

[11 of 36 positions shown; findings below may reference images not displayed]

FINDINGS: Low right paratracheal lymph node measures 2.1 cm and
appears necrotic.  A large nodal mass occupies the subcarinal and
right hilar regions, measuring approximately 5.8 x 7.5 cm.
Narrowing of the right upper and right middle lobe pulmonary
arteries in association.  Mass effect on the left atrium. A
necrotic appearing lymph node intimately associated with the
pericardial border measures 1.5 x 1.6 cm (image 39). Heart size
normal.  No pericardial effusion.

Marked narrowing of the anterior segmental bronchus of the right
upper lobe with complete obstruction of the right middle and right
lower lobe bronchi.  Mass-like consolidation in the right middle
lobe.  Peribronchovascular nodularity and airspace consolidation in
the right lower lobe.  Small right pleural effusion.  Possible
pleural thickening in the upper medial right hemithorax and is
image 9). Calcified pleural plaques in the left hemithorax.
IMPRESSION: 1.  Bulky mediastinal/right hilar nodal mass with mass-like
consolidation in the right middle lobe and postobstructive
pneumonitis/consolidation in the right lower lobe. Please see
additional findings discussion below.
2.  Small right pleural effusion, possibly malignant.
3.  Necrotic appearing lymph node intimately associated with the
pericardium.

CT ABDOMEN AND PELVIS
FINDINGS: Heterogeneous low attenuation lesions in the right
hepatic lobe measure up to 1.7 x 1.8 cm (image 60).  A necrotic
appearing nodal mass in the porta hepatis measures 3.0 x 3.8 cm.
Narrowing and probable invasion of the main portal vein in
association (example image 62).  Gallbladder is unremarkable.

A lesion in the right adrenal gland measures 1.2 x 1.8 cm.  Left
adrenal lesion measures 0.9 x 1.5 cm.  Kidneys, spleen, pancreas,
stomach and bowel are unremarkable.

A 2 mm nodule is seen in the posterior left lower quadrant,
adjacent to the descending colon (image 79).  Similarly, a
subcutaneous nodule just superior to the left iliac crest measures
1.0 x 1.3 cm (image 84).

Atherosclerotic calcification of the arterial vasculature without
abdominal aortic aneurysm.  No pathologically enlarged
retroperitoneal or mesenteric lymph nodes.  No free fluid.

Lytic expansion is seen in the right sacrum with associated
fracture (coronal image 51).  Sclerotic lesions are seen in the
right iliac wing and T3 vertebral body and are not necessarily
metastatic as bone islands would appear similar.
IMPRESSION: 1.  Metastatic disease involving the liver adrenal glands, porta
hepatis lymph nodes, and sacrum.  Additional soft tissue nodules in
the left lower quadrant and in the subcutaneous fat overlying the
left iliac crest.  Collectively and with findings in the chest
detailed above, findings are most consistent with stage IV primary
bronchogenic carcinoma.
2.  Necrotic appearing porta hepatis adenopathy with probable
invasion of the adjacent portal vein.
3.  Pathologic right sacral fracture.

## 2015-01-16 IMAGING — US IR FLUORO GUIDE CV LINE*R*
1 series · 2 of 2 positions shown · non-contrast
Comparison: none

CLINICAL DATA: Lung cancer

[Series 1: ir fluoro guide cv line*right* · 2 of 2 slices shown]
[im 1/2]
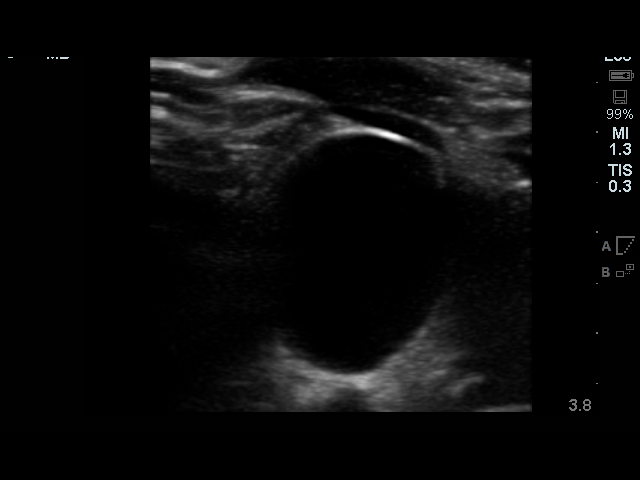
[im 2/2]
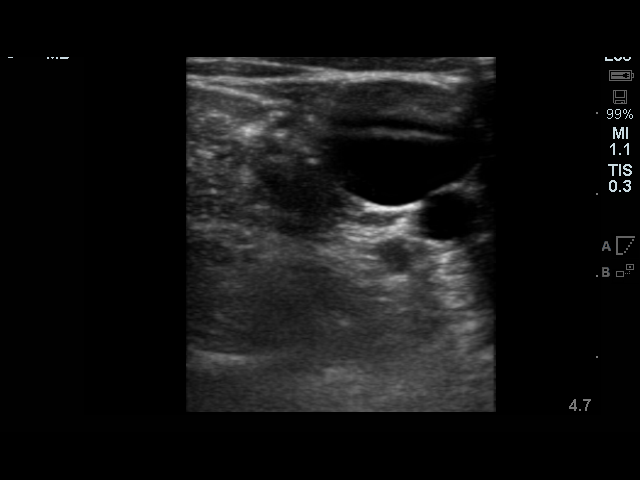

[2 of 2 positions shown; findings below may reference images not displayed]

ULTRASOUND GUIDANCE FOR VASCULAR ACCESS
RIGHT INTERNAL JUGULAR SINGLE LUMEN POWER PORT CATHETER INSERTION

Date: 03/09/2013 [DATE]

Radiologist:  Adza Baginda, M.D.

Medications:  2 grams ancefadministered within 1 hour of the
procedure.2 mg Versed, 100 mcg Fentanyl

Guidance:  Ultrasound and fluoroscopic

Fluoroscopy time:  48 seconds

Sedation time:  27 minutes

Contrast volume:  None.

Complications:  No immediate

PROCEDURE/FINDINGS:

Informed consent was obtained from the patient following
explanation of the procedure, risks, benefits and alternatives.
The patient understands, agrees and consents for the procedure.
All questions were addressed.  A time out was performed.

Maximal barrier sterile technique utilized including caps, mask,
sterile gowns, sterile gloves, large sterile drape, hand hygiene,
and 2% chlorhexidine scrub.

Under sterile conditions and local anesthesia, right internal
jugular micropuncture venous access was performed.  Access was
performed with ultrasound.  Images were obtained for documentation.
A guide wire was inserted followed by a transitional dilator.  This
allowed insertion of a guide wire and catheter into the IVC.
Measurements were obtained from the SVC / RA junction back to the
right IJ venotomy site.  In the right infraclavicular chest, a
subcutaneous pocket was created over the second anterior rib.  This
was done under sterile conditions and local anesthesia.  1%
lidocaine with epinephrine was utilized for this.  A 2.5 cm
incision was made in the skin.  Blunt dissection was performed to
create a subcutaneous pocket over the right pectoralis major
muscle.  The pocket was flushed with saline vigorously.  There was
adequate hemostasis.  The port catheter was assembled and checked
for leakage.  The port catheter was secured in the pocket with two
retention sutures.  The tubing was tunneled subcutaneously to the
right venotomy site and inserted into the SVC/RA junction through a
valved peel-away sheath.  Position was confirmed with fluoroscopy.
Images were obtained for documentation.  The patient tolerated the
procedure well.  No immediate complications.  Incisions were closed
in a two layer fashion with 4 - 0 Vicryl suture.  Dermabond was
applied to the skin. The port catheter was accessed, blood was
aspirated followed by saline and heparin flushes.  Needle was
removed.  A dry sterile dressing was applied.
IMPRESSION: Ultrasound and fluoroscopically guided right internal jugular
single lumen power port catheter insertion.  Tip in the SVC/RA
junction.  Catheter ready for use.
